# Patient Record
Sex: Female | Born: 1980 | Race: Black or African American | Hispanic: No | Marital: Single | Smoking: Never smoker
Health system: Southern US, Community
[De-identification: ages and names within clinical notes are randomized; demographics above are authoritative.]

## PROBLEM LIST (undated history)

## (undated) ENCOUNTER — Inpatient Hospital Stay (HOSPITAL_COMMUNITY): Payer: Self-pay

## (undated) DIAGNOSIS — N390 Urinary tract infection, site not specified: Secondary | ICD-10-CM

## (undated) DIAGNOSIS — R011 Cardiac murmur, unspecified: Secondary | ICD-10-CM

## (undated) DIAGNOSIS — N83209 Unspecified ovarian cyst, unspecified side: Secondary | ICD-10-CM

## (undated) DIAGNOSIS — Z8489 Family history of other specified conditions: Secondary | ICD-10-CM

## (undated) DIAGNOSIS — R87619 Unspecified abnormal cytological findings in specimens from cervix uteri: Secondary | ICD-10-CM

## (undated) DIAGNOSIS — D649 Anemia, unspecified: Secondary | ICD-10-CM

## (undated) DIAGNOSIS — IMO0002 Reserved for concepts with insufficient information to code with codable children: Secondary | ICD-10-CM

## (undated) HISTORY — DX: Unspecified abnormal cytological findings in specimens from cervix uteri: R87.619

## (undated) HISTORY — PX: THUMB ARTHROSCOPY: SHX2509

## (undated) HISTORY — PX: TONSILLECTOMY AND ADENOIDECTOMY: SHX28

## (undated) HISTORY — DX: Reserved for concepts with insufficient information to code with codable children: IMO0002

---
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
## 2001-12-04 ENCOUNTER — Emergency Department (HOSPITAL_COMMUNITY): Admission: EM | Admit: 2001-12-04 | Discharge: 2001-12-04 | Payer: Self-pay | Admitting: Emergency Medicine

## 2001-12-07 ENCOUNTER — Emergency Department (HOSPITAL_COMMUNITY): Admission: EM | Admit: 2001-12-07 | Discharge: 2001-12-08 | Payer: Self-pay | Admitting: Emergency Medicine

## 2001-12-28 ENCOUNTER — Encounter: Payer: Self-pay | Admitting: Endocrinology

## 2001-12-28 ENCOUNTER — Ambulatory Visit (HOSPITAL_COMMUNITY): Admission: RE | Admit: 2001-12-28 | Discharge: 2001-12-28 | Payer: Self-pay | Admitting: Endocrinology

## 2002-02-01 ENCOUNTER — Inpatient Hospital Stay (HOSPITAL_COMMUNITY): Admission: AD | Admit: 2002-02-01 | Discharge: 2002-02-01 | Payer: Self-pay | Admitting: Obstetrics and Gynecology

## 2002-06-28 ENCOUNTER — Other Ambulatory Visit: Admission: RE | Admit: 2002-06-28 | Discharge: 2002-06-28 | Payer: Self-pay | Admitting: Obstetrics and Gynecology

## 2002-09-29 ENCOUNTER — Encounter: Admission: RE | Admit: 2002-09-29 | Discharge: 2002-09-29 | Payer: Self-pay | Admitting: *Deleted

## 2002-09-30 ENCOUNTER — Emergency Department (HOSPITAL_COMMUNITY): Admission: EM | Admit: 2002-09-30 | Discharge: 2002-09-30 | Payer: Self-pay | Admitting: *Deleted

## 2002-10-06 ENCOUNTER — Ambulatory Visit (HOSPITAL_COMMUNITY): Admission: RE | Admit: 2002-10-06 | Discharge: 2002-10-06 | Payer: Self-pay | Admitting: *Deleted

## 2002-11-21 ENCOUNTER — Encounter: Admission: RE | Admit: 2002-11-21 | Discharge: 2002-11-21 | Payer: Self-pay | Admitting: Obstetrics and Gynecology

## 2004-01-29 ENCOUNTER — Encounter: Admission: RE | Admit: 2004-01-29 | Discharge: 2004-01-29 | Payer: Self-pay | Admitting: Obstetrics and Gynecology

## 2004-01-30 ENCOUNTER — Ambulatory Visit (HOSPITAL_COMMUNITY): Admission: RE | Admit: 2004-01-30 | Discharge: 2004-01-30 | Payer: Self-pay | Admitting: Obstetrics and Gynecology

## 2004-12-26 ENCOUNTER — Emergency Department (HOSPITAL_COMMUNITY): Admission: EM | Admit: 2004-12-26 | Discharge: 2004-12-26 | Payer: Self-pay | Admitting: Emergency Medicine

## 2008-12-14 ENCOUNTER — Ambulatory Visit: Payer: Self-pay | Admitting: Physician Assistant

## 2008-12-14 ENCOUNTER — Emergency Department (HOSPITAL_COMMUNITY): Admission: EM | Admit: 2008-12-14 | Discharge: 2008-12-14 | Payer: Self-pay | Admitting: Emergency Medicine

## 2008-12-14 ENCOUNTER — Inpatient Hospital Stay (HOSPITAL_COMMUNITY): Admission: AD | Admit: 2008-12-14 | Discharge: 2008-12-15 | Payer: Self-pay | Admitting: Obstetrics & Gynecology

## 2009-01-13 ENCOUNTER — Inpatient Hospital Stay (HOSPITAL_COMMUNITY): Admission: AD | Admit: 2009-01-13 | Discharge: 2009-01-13 | Payer: Self-pay | Admitting: Family Medicine

## 2009-07-02 ENCOUNTER — Emergency Department (HOSPITAL_COMMUNITY): Admission: EM | Admit: 2009-07-02 | Discharge: 2009-07-02 | Payer: Self-pay | Admitting: Family Medicine

## 2009-07-05 ENCOUNTER — Emergency Department (HOSPITAL_COMMUNITY): Admission: EM | Admit: 2009-07-05 | Discharge: 2009-07-06 | Payer: Self-pay | Admitting: Emergency Medicine

## 2009-07-07 ENCOUNTER — Emergency Department (HOSPITAL_COMMUNITY): Admission: EM | Admit: 2009-07-07 | Discharge: 2009-07-07 | Payer: Self-pay | Admitting: Emergency Medicine

## 2009-11-12 ENCOUNTER — Inpatient Hospital Stay (HOSPITAL_COMMUNITY): Admission: AD | Admit: 2009-11-12 | Discharge: 2009-11-12 | Payer: Self-pay | Admitting: Obstetrics & Gynecology

## 2009-11-12 ENCOUNTER — Ambulatory Visit: Payer: Self-pay | Admitting: Obstetrics and Gynecology

## 2010-03-17 ENCOUNTER — Emergency Department (HOSPITAL_COMMUNITY): Admission: EM | Admit: 2010-03-17 | Discharge: 2010-03-17 | Payer: Self-pay | Admitting: Emergency Medicine

## 2010-03-25 ENCOUNTER — Emergency Department (HOSPITAL_COMMUNITY): Admission: EM | Admit: 2010-03-25 | Discharge: 2010-03-25 | Payer: Self-pay | Admitting: Family Medicine

## 2010-04-13 ENCOUNTER — Inpatient Hospital Stay (HOSPITAL_COMMUNITY): Admission: AD | Admit: 2010-04-13 | Discharge: 2010-04-13 | Payer: Self-pay | Admitting: Obstetrics & Gynecology

## 2010-04-13 ENCOUNTER — Ambulatory Visit: Payer: Self-pay | Admitting: Nurse Practitioner

## 2010-04-28 ENCOUNTER — Inpatient Hospital Stay (HOSPITAL_COMMUNITY): Admission: AD | Admit: 2010-04-28 | Discharge: 2010-04-28 | Payer: Self-pay | Admitting: Obstetrics and Gynecology

## 2010-09-21 ENCOUNTER — Encounter: Payer: Self-pay | Admitting: *Deleted

## 2010-11-14 LAB — URINE MICROSCOPIC-ADD ON

## 2010-11-14 LAB — WET PREP, GENITAL: Trich, Wet Prep: NONE SEEN

## 2010-11-14 LAB — URINALYSIS, ROUTINE W REFLEX MICROSCOPIC
Bilirubin Urine: NEGATIVE
Bilirubin Urine: NEGATIVE
Glucose, UA: NEGATIVE mg/dL
Protein, ur: NEGATIVE mg/dL
Urobilinogen, UA: 0.2 mg/dL (ref 0.0–1.0)
pH: 5.5 (ref 5.0–8.0)
pH: 6 (ref 5.0–8.0)

## 2010-11-14 LAB — POCT PREGNANCY, URINE: Preg Test, Ur: NEGATIVE

## 2010-11-14 LAB — URINE CULTURE

## 2010-11-24 LAB — URINALYSIS, ROUTINE W REFLEX MICROSCOPIC
Bilirubin Urine: NEGATIVE
Leukocytes, UA: NEGATIVE
Nitrite: NEGATIVE
Protein, ur: NEGATIVE mg/dL
pH: 6.5 (ref 5.0–8.0)

## 2010-11-24 LAB — WET PREP, GENITAL

## 2010-11-24 LAB — POCT PREGNANCY, URINE: Preg Test, Ur: NEGATIVE

## 2010-11-24 LAB — URINE CULTURE: Colony Count: NO GROWTH

## 2010-11-24 LAB — URINE MICROSCOPIC-ADD ON

## 2010-11-24 LAB — GC/CHLAMYDIA PROBE AMP, GENITAL
Chlamydia, DNA Probe: NEGATIVE
GC Probe Amp, Genital: NEGATIVE

## 2010-12-03 LAB — URINE MICROSCOPIC-ADD ON

## 2010-12-03 LAB — URINALYSIS, ROUTINE W REFLEX MICROSCOPIC
Glucose, UA: NEGATIVE mg/dL
Protein, ur: NEGATIVE mg/dL

## 2010-12-03 LAB — POCT PREGNANCY, URINE: Preg Test, Ur: NEGATIVE

## 2010-12-09 LAB — CBC
Platelets: 246 10*3/uL (ref 150–400)
WBC: 4.8 10*3/uL (ref 4.0–10.5)

## 2010-12-10 LAB — WET PREP, GENITAL: Yeast Wet Prep HPF POC: NONE SEEN

## 2010-12-10 LAB — POCT URINALYSIS DIP (DEVICE)
Glucose, UA: NEGATIVE mg/dL
Ketones, ur: NEGATIVE mg/dL
Specific Gravity, Urine: 1.02 (ref 1.005–1.030)

## 2010-12-10 LAB — POCT PREGNANCY, URINE: Preg Test, Ur: POSITIVE

## 2010-12-10 LAB — ABO/RH: ABO/RH(D): A POS

## 2011-01-16 NOTE — Group Therapy Note (Signed)
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  She has been also seen by [facility] and is on Ortho Tri-  Cyclen and has had a recent Pap smear.  She is in today after a year since  her last ultrasound.  If something had to be done she wanted it done during  the summer when she does not have college and she is out for the summer now.  We are going to repeat her ultrasound to see where we stand with the size of  these.  If they have not grown I would go no further and we will follow this  depending on the results of the ultrasound.   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                                               Argentina Donovan, MD    PR/MEDQ  D:  01/29/2004  T:  01/29/2004  Job:  [id number]

## 2012-04-25 ENCOUNTER — Encounter (HOSPITAL_COMMUNITY): Payer: Self-pay | Admitting: *Deleted

## 2012-04-25 ENCOUNTER — Inpatient Hospital Stay (HOSPITAL_COMMUNITY): Payer: Medicaid Other

## 2012-04-25 ENCOUNTER — Inpatient Hospital Stay (HOSPITAL_COMMUNITY)
Admission: AD | Admit: 2012-04-25 | Discharge: 2012-04-25 | Disposition: A | Discharge status: Home / Self Care | Payer: Medicaid Other | Source: Ambulatory Visit | Attending: Obstetrics & Gynecology | Admitting: Obstetrics & Gynecology

## 2012-04-25 DIAGNOSIS — O2390 Unspecified genitourinary tract infection in pregnancy, unspecified trimester: Secondary | ICD-10-CM | POA: Insufficient documentation

## 2012-04-25 DIAGNOSIS — R109 Unspecified abdominal pain: Secondary | ICD-10-CM | POA: Insufficient documentation

## 2012-04-25 DIAGNOSIS — R102 Pelvic and perineal pain: Secondary | ICD-10-CM

## 2012-04-25 DIAGNOSIS — B9689 Other specified bacterial agents as the cause of diseases classified elsewhere: Secondary | ICD-10-CM | POA: Insufficient documentation

## 2012-04-25 DIAGNOSIS — A499 Bacterial infection, unspecified: Secondary | ICD-10-CM | POA: Insufficient documentation

## 2012-04-25 DIAGNOSIS — N949 Unspecified condition associated with female genital organs and menstrual cycle: Secondary | ICD-10-CM

## 2012-04-25 DIAGNOSIS — D279 Benign neoplasm of unspecified ovary: Secondary | ICD-10-CM | POA: Insufficient documentation

## 2012-04-25 DIAGNOSIS — M549 Dorsalgia, unspecified: Secondary | ICD-10-CM

## 2012-04-25 DIAGNOSIS — N760 Acute vaginitis: Secondary | ICD-10-CM | POA: Insufficient documentation

## 2012-04-25 HISTORY — DX: Urinary tract infection, site not specified: N39.0

## 2012-04-25 HISTORY — DX: Unspecified ovarian cyst, unspecified side: N83.209

## 2012-04-25 HISTORY — DX: Cardiac murmur, unspecified: R01.1

## 2012-04-25 HISTORY — DX: Anemia, unspecified: D64.9

## 2012-04-25 LAB — URINALYSIS, ROUTINE W REFLEX MICROSCOPIC
Bilirubin Urine: NEGATIVE
Nitrite: NEGATIVE
Specific Gravity, Urine: >1.03 — ABNORMAL HIGH (ref 1.005–1.030)
pH: 6 (ref 5.0–8.0)

## 2012-04-25 LAB — CBC
HCT: 38.9 % (ref 36.0–46.0)
MCH: 31 pg (ref 26.0–34.0)
MCHC: 33.9 g/dL (ref 30.0–36.0)
MCV: 91.3 fL (ref 78.0–100.0)
RDW: 12.5 % (ref 11.5–15.5)

## 2012-04-25 LAB — WET PREP, GENITAL: Yeast Wet Prep HPF POC: NONE SEEN

## 2012-04-25 LAB — URINE MICROSCOPIC-ADD ON

## 2012-04-25 LAB — POCT PREGNANCY, URINE: Preg Test, Ur: POSITIVE — AB

## 2012-04-25 MED ORDER — METRONIDAZOLE 500 MG PO TABS: 500.0000 mg | ORAL_TABLET | Freq: Two times a day (BID) | ORAL | Status: AC

## 2012-04-25 NOTE — MAU Provider Note (Signed)
History     CSN: [id number]  Arrival date and time: 04/25/12 4098   First Provider Initiated Contact with Patient 04/25/12 1007      Chief Complaint  Patient presents with  . Possible Pregnancy  . Abdominal Pain   HPI Katherine Cooley is 31 y.o. G2P0010 [redacted]w[redacted]d weeks presenting with lower abdominal pain.  Period is late and "I was praying I wasn't pregnant".  Test here is positive.  Denies vaginal bleeding or abnormal discharge.  Denies UTI.  Discomfort in the lower abdomen is midline and described as a menstrual cramp.  LMP normal for her on 03/24/12.  Patient is unsure of plans for this pregnancy.    Past Medical History  Diagnosis Date  . Heart murmur   . Anemia   . Urinary tract infection   . Ovarian cyst     Past Surgical History  Procedure Date  . Tonsillectomy and adenoidectomy     just adenoids    Family History  Problem Relation Age of Onset  . Other Father     History  Substance Use Topics  . Smoking status: Never Smoker   . Smokeless tobacco: Never Used  . Alcohol Use: Yes    Allergies: No Known Allergies  Prescriptions prior to admission  Medication Sig Dispense Refill  . ibuprofen (ADVIL,MOTRIN) 200 MG tablet Take 400 mg by mouth daily as needed. For pain. Pt also uses a prescription strength of Ibuprofen but she does not know  the strength. Has used in the last month.        Review of Systems  Constitutional: Negative.   Respiratory: Negative.   Cardiovascular: Negative.   Gastrointestinal: Positive for abdominal pain (lower abdominal cramping).  Genitourinary: Negative for dysuria and urgency.       Neg for bleeding or discharge   Physical Exam   Blood pressure 132/77, pulse 88, temperature 98.6 F (37 C), temperature source Oral, resp. rate 18, height 5' 5.5" (1.664 m), weight 69.854 kg (154 lb), last menstrual period 03/24/2012.  Physical Exam  Constitutional: She is oriented to person, place, and time. She appears well-developed and  well-nourished. No distress.  HENT:  Head: Normocephalic.  Neck: Normal range of motion.  Cardiovascular: Normal rate.   Respiratory: Effort normal.  GI: Soft. She exhibits no distension and no mass. There is no tenderness. There is no rebound and no guarding.  Genitourinary: Uterus is not enlarged and not tender. Cervix exhibits friability. Cervix exhibits no motion tenderness. Right adnexum displays no mass, no tenderness and no fullness. Left adnexum displays no mass, no tenderness and no fullness. No bleeding around the vagina. No vaginal discharge found.  Neurological: She is alert and oriented to person, place, and time.  Skin: Skin is warm and dry.  Psychiatric: She has a normal mood and affect. Her behavior is normal.    Results for orders placed during the hospital encounter of 04/25/12 (from the past 24 hour(s))  URINALYSIS, ROUTINE W REFLEX MICROSCOPIC     Status: Abnormal   Collection Time   04/25/12  9:20 AM      Component Value Range   Color, Urine YELLOW  YELLOW   APPearance CLEAR  CLEAR   Specific Gravity, Urine >1.030 (*) 1.005 - 1.030   pH 6.0  5.0 - 8.0   Glucose, UA NEGATIVE  NEGATIVE mg/dL   Hgb urine dipstick LARGE (*) NEGATIVE   Bilirubin Urine NEGATIVE  NEGATIVE   Ketones, ur 40 (*) NEGATIVE mg/dL  Protein, ur NEGATIVE  NEGATIVE mg/dL   Urobilinogen, UA 1.0  0.0 - 1.0 mg/dL   Nitrite NEGATIVE  NEGATIVE   Leukocytes, UA NEGATIVE  NEGATIVE  URINE MICROSCOPIC-ADD ON     Status: Abnormal   Collection Time   04/25/12  9:20 AM      Component Value Range   Squamous Epithelial / LPF FEW (*) RARE   WBC, UA 0-2  <3 WBC/hpf   RBC / HPF 7-10  <3 RBC/hpf   Bacteria, UA FEW (*) RARE   Urine-Other MUCOUS PRESENT    POCT PREGNANCY, URINE     Status: Abnormal   Collection Time   04/25/12  9:34 AM      Component Value Range   Preg Test, Ur POSITIVE (*) NEGATIVE  WET PREP, GENITAL     Status: Abnormal   Collection Time   04/25/12 10:21 AM      Component Value Range    Yeast Wet Prep HPF POC NONE SEEN  NONE SEEN   Trich, Wet Prep NONE SEEN  NONE SEEN   Clue Cells Wet Prep HPF POC FEW (*) NONE SEEN   WBC, Wet Prep HPF POC FEW (*) NONE SEEN  CBC     Status: Normal   Collection Time   04/25/12 10:30 AM      Component Value Range   WBC 7.0  4.0 - 10.5 K/uL   RBC 4.26  3.87 - 5.11 MIL/uL   Hemoglobin 13.2  12.0 - 15.0 g/dL   HCT 16.1  09.6 - 04.5 %   MCV 91.3  78.0 - 100.0 fL   MCH 31.0  26.0 - 34.0 pg   MCHC 33.9  30.0 - 36.0 g/dL   RDW 40.9  81.1 - 91.4 %   Platelets 298  150 - 400 K/uL  HCG, QUANTITATIVE, PREGNANCY     Status: Abnormal   Collection Time   04/25/12 10:30 AM      Component Value Range   hCG, Beta Chain, Quant, S 4289 (*) <5 mIU/mL   MAU Course  Procedures  GC/CHL cultures to lab  MDM   Assessment and Plan  A:  Intrauterine gestational sac at [redacted]w[redacted]d gestation     Abdominal pain in early pregnancy     Bacterial vaginosis    Dermoid cysts-known to patient and seen today on U/S    P:  Return for follow up BHCG in 48hrs      Rx for Flagyl     Return sooner for heavy bleeding or increase in abdominal pain  Alois Mincer,EVE M 04/25/2012, 10:11 AM

## 2012-04-25 NOTE — MAU Note (Signed)
Period was to have come on Wed.  Has not done a home test, has been cramping in lower abd- feels like going to start.

## 2012-04-26 LAB — GC/CHLAMYDIA PROBE AMP, GENITAL
Chlamydia, DNA Probe: NEGATIVE
GC Probe Amp, Genital: NEGATIVE

## 2012-04-27 ENCOUNTER — Encounter (HOSPITAL_COMMUNITY): Payer: Self-pay | Admitting: *Deleted

## 2012-04-27 ENCOUNTER — Inpatient Hospital Stay (HOSPITAL_COMMUNITY)
Admission: AD | Admit: 2012-04-27 | Discharge: 2012-04-27 | Disposition: A | Discharge status: Home / Self Care | Payer: Medicaid Other | Source: Ambulatory Visit | Attending: Obstetrics & Gynecology | Admitting: Obstetrics & Gynecology

## 2012-04-27 DIAGNOSIS — R109 Unspecified abdominal pain: Secondary | ICD-10-CM | POA: Insufficient documentation

## 2012-04-27 DIAGNOSIS — O99891 Other specified diseases and conditions complicating pregnancy: Secondary | ICD-10-CM | POA: Insufficient documentation

## 2012-04-27 DIAGNOSIS — O26899 Other specified pregnancy related conditions, unspecified trimester: Secondary | ICD-10-CM

## 2012-04-27 LAB — HCG, QUANTITATIVE, PREGNANCY: hCG, Beta Chain, Quant, S: 8971 m[IU]/mL — ABNORMAL HIGH (ref <5)

## 2012-04-27 NOTE — MAU Note (Signed)
Feeling ok, denies pain or bleeding.  Did get rx filled for flagyl, started it yesterday- doing ok.

## 2012-04-27 NOTE — MAU Provider Note (Signed)
Attestation of Attending Supervision of Advanced Practitioner (CNM/NP): Evaluation and management procedures were performed by the Advanced Practitioner under my supervision and collaboration.  I have reviewed the Advanced Practitioner's note and chart, and I agree with the management and plan.  Josseline Reddin, MD, FACOG Attending Obstetrician & Gynecologist Faculty Practice, [facility]  

## 2012-04-27 NOTE — MAU Provider Note (Signed)
  History     CSN: [id number]  Arrival date and time: 04/27/12 4098   First Provider Initiated Contact with Patient 04/27/12 [phone]      Chief Complaint  Patient presents with  . Follow-up   HPI Katherine Cooley 31 y.o. [redacted]w[redacted]d   Here today for repeat quant.  Was seen on 04-25-12 for abdominal pain in pregnancy.  Ultrasound showed IUGS but no yolk sac and no fetal pole.  No vaginal bleeding.  Slight cramping - 2/10, less than on 04-25-12.    OB History    Grav Para Term Preterm Abortions TAB SAB Ect Mult Living   2 0 0 0 1 0 1 0 0 0       Past Medical History  Diagnosis Date  . Heart murmur   . Anemia   . Urinary tract infection   . Ovarian cyst     Past Surgical History  Procedure Date  . Tonsillectomy and adenoidectomy     just adenoids    Family History  Problem Relation Age of Onset  . Other Father     History  Substance Use Topics  . Smoking status: Never Smoker   . Smokeless tobacco: Never Used  . Alcohol Use: Yes    Allergies: No Known Allergies  Prescriptions prior to admission  Medication Sig Dispense Refill  . metroNIDAZOLE (FLAGYL) 500 MG tablet Take 1 tablet (500 mg total) by mouth 2 (two) times daily.  14 tablet  0    Review of Systems  Constitutional: Negative for fever.  Gastrointestinal: Negative for nausea and vomiting.       Mild cramping  Genitourinary:       No vaginal discharge. No vaginal bleeding. No dysuria.   Physical Exam   Blood pressure 138/76, pulse 96, temperature 98.6 F (37 C), temperature source Oral, resp. rate 18, last menstrual period 03/24/2012.  Physical Exam  Nursing note and vitals reviewed. Constitutional: She is oriented to person, place, and time. She appears well-developed and well-nourished.  HENT:  Head: Normocephalic.  Eyes: EOM are normal.  Neck: Neck supple.  Musculoskeletal: Normal range of motion.  Neurological: She is alert and oriented to person, place, and time.  Skin: Skin is warm and dry.    Psychiatric: She has a normal mood and affect.    MAU Course  Procedures  MDM  Results for ANTONISHA, WASKEY (MRN [id number]) as of 04/27/2012 09:28  Ref. Range 04/25/2012 10:30 04/25/2012 11:02 04/25/2012 12:18 04/27/2012 02:12 04/27/2012 08:15  hCG, Beta Chain, Quant, S Latest Range: <5 mIU/mL 4289 (H)    8971 (H)    Assessment and Plan  Appropriately rising quants in pregnancy  Plan Will repeat ultrasound after 05-02-12 as no yolk sac seen yet on ultrasound. Advised to return with any vaginal bleeding or worsening abdominal pain. Take Tylenol 325 mg 2 tablets by mouth every 4 hours if needed for pain.   BURLESON,TERRI 04/27/2012, 9:25 AM

## 2012-05-03 ENCOUNTER — Ambulatory Visit (HOSPITAL_COMMUNITY)
Admit: 2012-05-03 | Discharge: 2012-05-03 | Disposition: A | Discharge status: Home / Self Care | Payer: Medicaid Other | Attending: Nurse Practitioner | Admitting: Nurse Practitioner

## 2012-05-03 ENCOUNTER — Inpatient Hospital Stay (HOSPITAL_COMMUNITY)
Admission: AD | Admit: 2012-05-03 | Discharge: 2012-05-03 | Disposition: A | Discharge status: Home / Self Care | Payer: Medicaid Other | Source: Ambulatory Visit | Attending: Obstetrics & Gynecology | Admitting: Obstetrics & Gynecology

## 2012-05-03 DIAGNOSIS — Z1389 Encounter for screening for other disorder: Secondary | ICD-10-CM

## 2012-05-03 DIAGNOSIS — R109 Unspecified abdominal pain: Secondary | ICD-10-CM | POA: Insufficient documentation

## 2012-05-03 DIAGNOSIS — O3680X0 Pregnancy with inconclusive fetal viability, not applicable or unspecified: Secondary | ICD-10-CM | POA: Insufficient documentation

## 2012-05-03 DIAGNOSIS — O34599 Maternal care for other abnormalities of gravid uterus, unspecified trimester: Secondary | ICD-10-CM | POA: Insufficient documentation

## 2012-05-03 DIAGNOSIS — D279 Benign neoplasm of unspecified ovary: Secondary | ICD-10-CM | POA: Insufficient documentation

## 2012-05-03 DIAGNOSIS — Z3490 Encounter for supervision of normal pregnancy, unspecified, unspecified trimester: Secondary | ICD-10-CM

## 2012-05-03 DIAGNOSIS — Z363 Encounter for antenatal screening for malformations: Secondary | ICD-10-CM

## 2012-05-03 DIAGNOSIS — O26899 Other specified pregnancy related conditions, unspecified trimester: Secondary | ICD-10-CM

## 2012-05-03 NOTE — MAU Note (Signed)
Pt here for repeat u/s for viability, denies bleeding, still notes mild abd cramping.

## 2012-05-03 NOTE — MAU Provider Note (Signed)
I have seen this pt and reviewed her U/S f/u results today.    S:  She denies abdominal pain, LOF, vaginal bleeding, vaginal itching/burning, urinary symptoms, h/a, dizziness, n/v, or fever/chills.    O: BP 118/73  Pulse 75  Temp 98.6 F (37 C) (Oral)  Resp 16  Ht 5' 5.5" (1.664 m)  Wt 65.091 kg (143 lb 8 oz)  BMI 23.52 kg/m2  LMP 03/24/2012  A: 1. Normal IUP (intrauterine pregnancy) on prenatal ultrasound   2.  Ovarian dermoids  P: Reviewed U/S findings with pt today.  She has known hx of dermoids.   D/C home Pregnancy verification letter and list of providers given Return to MAU as needed   Sharen Counter Certified Nurse-Midwife

## 2012-05-03 NOTE — MAU Provider Note (Signed)
Attestation of Attending Supervision of Advanced Practitioner (CNM/NP): Evaluation and management procedures were performed by the Advanced Practitioner under my supervision and collaboration.  I have reviewed the Advanced Practitioner's note and chart, and I agree with the management and plan.  HARRAWAY-Torti, Benedicto Capozzi 1:51 PM      

## 2012-06-15 ENCOUNTER — Encounter (HOSPITAL_COMMUNITY): Payer: Self-pay | Admitting: *Deleted

## 2012-06-15 ENCOUNTER — Inpatient Hospital Stay (HOSPITAL_COMMUNITY)
Admission: AD | Admit: 2012-06-15 | Discharge: 2012-06-15 | Disposition: A | Discharge status: Home / Self Care | Payer: Medicaid Other | Source: Ambulatory Visit | Attending: Obstetrics & Gynecology | Admitting: Obstetrics & Gynecology

## 2012-06-15 DIAGNOSIS — N390 Urinary tract infection, site not specified: Secondary | ICD-10-CM | POA: Insufficient documentation

## 2012-06-15 DIAGNOSIS — M545 Low back pain, unspecified: Secondary | ICD-10-CM | POA: Insufficient documentation

## 2012-06-15 DIAGNOSIS — O2340 Unspecified infection of urinary tract in pregnancy, unspecified trimester: Secondary | ICD-10-CM

## 2012-06-15 DIAGNOSIS — R109 Unspecified abdominal pain: Secondary | ICD-10-CM | POA: Insufficient documentation

## 2012-06-15 DIAGNOSIS — O2390 Unspecified genitourinary tract infection in pregnancy, unspecified trimester: Secondary | ICD-10-CM | POA: Insufficient documentation

## 2012-06-15 LAB — URINALYSIS, ROUTINE W REFLEX MICROSCOPIC
Bilirubin Urine: NEGATIVE
Glucose, UA: NEGATIVE mg/dL
Ketones, ur: >80 mg/dL — AB
Leukocytes, UA: NEGATIVE
Nitrite: NEGATIVE
Protein, ur: NEGATIVE mg/dL
Specific Gravity, Urine: >1.03 — ABNORMAL HIGH (ref 1.005–1.030)
Urobilinogen, UA: 0.2 mg/dL (ref 0.0–1.0)
pH: 6 (ref 5.0–8.0)

## 2012-06-15 LAB — URINE MICROSCOPIC-ADD ON

## 2012-06-15 MED ORDER — ACETAMINOPHEN 325 MG PO TABS
650.0000 mg | ORAL_TABLET | Freq: Once | ORAL | Status: AC
  Administered 2012-06-15: 650 mg via ORAL
  Filled 2012-06-15: qty 2

## 2012-06-15 MED ORDER — SULFAMETHOXAZOLE-TRIMETHOPRIM 800-160 MG PO TABS: 1.0000 | ORAL_TABLET | Freq: Two times a day (BID) | ORAL | Status: DC

## 2012-06-15 NOTE — MAU Note (Signed)
Patient states she has been having low back pain at night and abdominal pain off and on. Urinary frequency. Denies any bleeding or vaginal discharge.

## 2012-06-15 NOTE — MAU Provider Note (Signed)
  History     CSN: [id number]  Arrival date and time: 06/15/12 1706   First Provider Initiated Contact with Patient 06/15/12 1916      Chief Complaint  Patient presents with  . Back Pain  . Abdominal Pain   HPI Katherine Cooley 31 y.o. [redacted]w[redacted]d  Having bilateral low back pain and midline lower abdominal pain.  Thinks she may have a UTI.  OB History    Grav Para Term Preterm Abortions TAB SAB Ect Mult Living   2 0 0 0 1 0 1 0 0 0       Past Medical History  Diagnosis Date  . Heart murmur   . Anemia   . Urinary tract infection   . Ovarian cyst     Past Surgical History  Procedure Date  . Tonsillectomy and adenoidectomy     just adenoids    Family History  Problem Relation Age of Onset  . Other Father     History  Substance Use Topics  . Smoking status: Never Smoker   . Smokeless tobacco: Never Used  . Alcohol Use: Yes    Allergies:  Allergies  Allergen Reactions  . Latex Itching    Prescriptions prior to admission  Medication Sig Dispense Refill  . Prenatal Vit-Fe Fumarate-FA (PRENATAL MULTIVITAMIN) TABS Take 1 tablet by mouth every morning.        Review of Systems  Constitutional: Negative for fever.  Gastrointestinal: Positive for abdominal pain. Negative for nausea, vomiting, diarrhea and constipation.  Musculoskeletal: Positive for back pain.   Physical Exam   Blood pressure 125/70, pulse 77, temperature 98.5 F (36.9 C), temperature source Oral, resp. rate 16, height 5\' 7"  (1.702 m), weight 67.586 kg (149 lb), last menstrual period 03/24/2012, SpO2 100.00%.  Physical Exam  Nursing note and vitals reviewed. Constitutional: She is oriented to person, place, and time. She appears well-developed and well-nourished.  HENT:  Head: Normocephalic.  Eyes: EOM are normal.  Neck: Neck supple.  GI: Soft. There is tenderness. There is no rebound and no guarding.       Mild lower abdominal tenderness  Genitourinary:       Cervix - closed, thick and  firm  Musculoskeletal: Normal range of motion.  Neurological: She is alert and oriented to person, place, and time.  Skin: Skin is warm and dry.  Psychiatric: She has a normal mood and affect.    MAU Course  Procedures  MDM First urine collection inadvertently did not get sent to the lab.  Will collect again and send for urinalysis.  Assessment and Plan  Care assumed by H. Mathews Robinsons, CNM at 2000  1. Urinary tract infection affecting care of mother, antepartum   Bactrim DS BID x7 Will call and schedule appointment with [facility] importance of early prenatal care  BURLESON,TERRI 06/15/2012, 7:30 PM

## 2012-06-17 NOTE — MAU Provider Note (Signed)
Attestation of Attending Supervision of Advanced Practitioner (CNM/NP): Evaluation and management procedures were performed by the Advanced Practitioner under my supervision and collaboration.  I have reviewed the Advanced Practitioner's note and chart, and I agree with the management and plan.  HARRAWAY-Lamarche, Vyom Brass 12:45 PM     

## 2012-07-04 ENCOUNTER — Ambulatory Visit (INDEPENDENT_AMBULATORY_CARE_PROVIDER_SITE_OTHER): Payer: Medicaid Other | Admitting: Obstetrics and Gynecology

## 2012-07-04 DIAGNOSIS — Z331 Pregnant state, incidental: Secondary | ICD-10-CM

## 2012-07-04 LAB — POCT URINALYSIS DIPSTICK
Glucose, UA: NEGATIVE
Nitrite, UA: NEGATIVE
Urobilinogen, UA: 1

## 2012-07-04 NOTE — Progress Notes (Signed)
NOB interview. Was treated at [facility] for UTI 06/15/12. Denies any sx currently. Pt is a fraternal twin.

## 2012-07-05 LAB — PRENATAL PANEL VII
Basophils Relative: 0 % (ref 0–1)
HCT: 35.1 % — ABNORMAL LOW (ref 36.0–46.0)
HIV: NONREACTIVE
Hemoglobin: 12 g/dL (ref 12.0–15.0)
Lymphocytes Relative: 28 % (ref 12–46)
Lymphs Abs: 2.6 10*3/uL (ref 0.7–4.0)
MCHC: 34.2 g/dL (ref 30.0–36.0)
Monocytes Absolute: 0.6 10*3/uL (ref 0.1–1.0)
Monocytes Relative: 6 % (ref 3–12)
Neutro Abs: 6.1 10*3/uL (ref 1.7–7.7)
Rh Type: POSITIVE
Rubella: 121.5 IU/mL — ABNORMAL HIGH

## 2012-07-06 ENCOUNTER — Encounter: Payer: Self-pay | Admitting: Obstetrics and Gynecology

## 2012-07-06 ENCOUNTER — Ambulatory Visit (INDEPENDENT_AMBULATORY_CARE_PROVIDER_SITE_OTHER): Payer: Medicaid Other | Admitting: Obstetrics and Gynecology

## 2012-07-06 VITALS — BP 100/62 | Wt 145.0 lb

## 2012-07-06 DIAGNOSIS — Z3689 Encounter for other specified antenatal screening: Secondary | ICD-10-CM

## 2012-07-06 DIAGNOSIS — Z3490 Encounter for supervision of normal pregnancy, unspecified, unspecified trimester: Secondary | ICD-10-CM

## 2012-07-06 DIAGNOSIS — Z331 Pregnant state, incidental: Secondary | ICD-10-CM

## 2012-07-06 DIAGNOSIS — Z124 Encounter for screening for malignant neoplasm of cervix: Secondary | ICD-10-CM

## 2012-07-06 LAB — POCT URINALYSIS DIPSTICK
Bilirubin, UA: NEGATIVE
Glucose, UA: NEGATIVE
Nitrite, UA: NEGATIVE
Spec Grav, UA: <=1.005

## 2012-07-06 LAB — POCT WET PREP (WET MOUNT): pH: 4.5

## 2012-07-06 LAB — CULTURE, OB URINE
Colony Count: NO GROWTH
Organism ID, Bacteria: NO GROWTH
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
## 2012-07-06 NOTE — Progress Notes (Signed)
[redacted]w[redacted]d LMP--03/24/2012 Last pap smear ?

## 2012-07-07 LAB — PAP IG, CT-NG, RFX HPV ASCU

## 2012-08-03 ENCOUNTER — Ambulatory Visit (INDEPENDENT_AMBULATORY_CARE_PROVIDER_SITE_OTHER): Payer: Medicaid Other

## 2012-08-03 ENCOUNTER — Ambulatory Visit (INDEPENDENT_AMBULATORY_CARE_PROVIDER_SITE_OTHER): Payer: Medicaid Other | Admitting: Obstetrics and Gynecology

## 2012-08-03 VITALS — BP 102/60 | Wt 150.0 lb

## 2012-08-03 DIAGNOSIS — Z1389 Encounter for screening for other disorder: Secondary | ICD-10-CM

## 2012-08-03 DIAGNOSIS — Z3490 Encounter for supervision of normal pregnancy, unspecified, unspecified trimester: Secondary | ICD-10-CM

## 2012-08-03 DIAGNOSIS — Z3689 Encounter for other specified antenatal screening: Secondary | ICD-10-CM

## 2012-08-03 DIAGNOSIS — Z363 Encounter for antenatal screening for malformations: Secondary | ICD-10-CM

## 2012-08-03 DIAGNOSIS — D279 Benign neoplasm of unspecified ovary: Secondary | ICD-10-CM

## 2012-08-03 NOTE — Progress Notes (Signed)
Pt stated no issues today.  Ultrasound shows:  SIUP  S=D     [place] EDD: 12/29/2012             EFW: 10 oz           Cervical length: 5.60 cm           Placenta localization: posterior           Fetal presentation: breech                    Anatomy survey is normal           Gender : female Comments:Breech presentation, posterior placenta (placental edge is 2.6cm from internal os normal.fluid is normal (vertical pocket = 3.6 cm No anomalies seen (profile,palate,nasal bone ,philtrum,open hands,5th digit,feet,heel seen. Bilateral dermoid cyst are noted (right dermoid =1.3x 1.0x 1.1 cm,left dermoid =2.7 x 1.6x 2.5cm ( right dermoid appears smaller than previously seen. Left dermoid size is  Unchanged . There is no color doppler flow present on either dermoid cyst. No fluid in CDS, normal adenexas . Follow up dermoid cyst as clinically indicated .

## 2012-08-04 LAB — US OB COMP + 14 WK

## 2012-08-30 ENCOUNTER — Ambulatory Visit (INDEPENDENT_AMBULATORY_CARE_PROVIDER_SITE_OTHER): Payer: Medicaid Other | Admitting: Obstetrics and Gynecology

## 2012-08-30 ENCOUNTER — Encounter: Payer: Self-pay | Admitting: Obstetrics and Gynecology

## 2012-08-30 VITALS — BP 100/60 | Wt 155.0 lb

## 2012-08-30 DIAGNOSIS — Z331 Pregnant state, incidental: Secondary | ICD-10-CM

## 2012-08-30 NOTE — Progress Notes (Signed)
Pt c/o back pain and some pressure in lower part of stomach. Pt also c/o numbness in left arm.

## 2012-08-30 NOTE — Patient Instructions (Signed)

## 2012-08-30 NOTE — Progress Notes (Signed)
[redacted]w[redacted]d Pt denies ctx, lof or vb Has lower back pain Back no CVATB glucola @[place] 

## 2012-09-27 ENCOUNTER — Encounter: Payer: Medicaid Other | Admitting: Obstetrics and Gynecology

## 2012-09-27 ENCOUNTER — Other Ambulatory Visit: Payer: Medicaid Other

## 2012-10-03 ENCOUNTER — Other Ambulatory Visit: Payer: Medicaid Other

## 2012-10-03 ENCOUNTER — Encounter: Payer: Medicaid Other | Admitting: Obstetrics and Gynecology

## 2012-10-04 ENCOUNTER — Ambulatory Visit: Payer: Medicaid Other | Admitting: Obstetrics and Gynecology

## 2012-10-04 ENCOUNTER — Other Ambulatory Visit: Payer: Medicaid Other

## 2012-10-04 VITALS — BP 110/60 | Wt 160.0 lb

## 2012-10-04 DIAGNOSIS — Z3490 Encounter for supervision of normal pregnancy, unspecified, unspecified trimester: Secondary | ICD-10-CM

## 2012-10-04 NOTE — Progress Notes (Signed)
[redacted]w[redacted]d No complaints today. 1 hr gtt today.

## 2012-10-06 LAB — GLUCOSE TOLERANCE, 1 HOUR (50G) W/O FASTING: Glucose, 1 Hour GTT: 128 mg/dL (ref 70–140)

## 2012-10-18 ENCOUNTER — Encounter: Payer: Medicaid Other | Admitting: Obstetrics and Gynecology

## 2012-10-18 ENCOUNTER — Ambulatory Visit: Payer: Medicaid Other | Admitting: Obstetrics and Gynecology

## 2012-10-18 ENCOUNTER — Encounter: Payer: Self-pay | Admitting: Obstetrics and Gynecology

## 2012-10-18 VITALS — BP 118/68 | Wt 162.0 lb

## 2012-10-18 DIAGNOSIS — R102 Pelvic and perineal pain: Secondary | ICD-10-CM

## 2012-10-18 DIAGNOSIS — Z3490 Encounter for supervision of normal pregnancy, unspecified, unspecified trimester: Secondary | ICD-10-CM

## 2012-10-18 DIAGNOSIS — R109 Unspecified abdominal pain: Secondary | ICD-10-CM

## 2012-10-18 DIAGNOSIS — N390 Urinary tract infection, site not specified: Secondary | ICD-10-CM

## 2012-10-18 LAB — POCT URINALYSIS DIPSTICK
Bilirubin, UA: NEGATIVE
Glucose, UA: NEGATIVE
Ketones, UA: NEGATIVE
Nitrite, UA: NEGATIVE
pH, UA: 7

## 2012-10-18 NOTE — Progress Notes (Signed)
[redacted]w[redacted]d Pt c/o back pain and cramping, not regular.  No meds required Chem 10 urine suspicious.   Culture sent

## 2012-10-19 LAB — CULTURE, OB URINE
Colony Count: NO GROWTH
Organism ID, Bacteria: NO GROWTH

## 2012-10-26 ENCOUNTER — Telehealth: Payer: Self-pay | Admitting: Obstetrics and Gynecology

## 2012-10-26 NOTE — Telephone Encounter (Signed)
TC from pt. States has been having cramping x a few days and sometimes feels tightness in abd. Occurred frequently 10/25/12 but today is about once/hour.  Also feeling somewhat lightheaded after eating sausage biscuit and water.  Advised to eat more protein. +FM. No UTI sx.  ADvised increased water. Will consult with provider.

## 2012-10-26 NOTE — Telephone Encounter (Signed)
LM to return call.

## 2012-10-26 NOTE — Telephone Encounter (Signed)
Tc fro pt. Per [facility] advised increased water. If cramping/cont occur 5 or mor times per hr or has any other concerns to call.  Pt verbalizes comprehension. SChed appt 10/27/12

## 2012-10-27 ENCOUNTER — Ambulatory Visit: Payer: Medicaid Other | Admitting: Certified Nurse Midwife

## 2012-10-27 VITALS — BP 118/60 | Wt 164.0 lb

## 2012-10-27 DIAGNOSIS — Z331 Pregnant state, incidental: Secondary | ICD-10-CM

## 2012-10-27 DIAGNOSIS — N760 Acute vaginitis: Secondary | ICD-10-CM

## 2012-10-27 DIAGNOSIS — N390 Urinary tract infection, site not specified: Secondary | ICD-10-CM

## 2012-10-27 NOTE — Progress Notes (Deleted)
Cramping on a scale of 10 she is a 10 Dizziness Pt is 63w0d1

## 2012-10-27 NOTE — Progress Notes (Signed)
See note below for 10-27-2012

## 2012-10-28 ENCOUNTER — Telehealth: Payer: Self-pay | Admitting: Obstetrics and Gynecology

## 2012-10-28 ENCOUNTER — Other Ambulatory Visit: Payer: Self-pay | Admitting: Obstetrics and Gynecology

## 2012-10-28 ENCOUNTER — Telehealth: Payer: Self-pay | Admitting: Certified Nurse Midwife

## 2012-10-28 LAB — PAP IG W/ RFLX HPV ASCU

## 2012-10-28 MED ORDER — NITROFURANTOIN MONOHYD MACRO 100 MG PO CAPS: 100.0000 mg | ORAL_CAPSULE | Freq: Two times a day (BID) | ORAL | Status: AC

## 2012-10-28 NOTE — Telephone Encounter (Signed)
Returned pt's call.States Rx for antibiotic not called in. Clarified with [facility]. Macrobid ordered. Was called to W/L. Should have been to [facility]. Rx reordered.

## 2012-10-28 NOTE — Telephone Encounter (Signed)
Returned pt's call. LM to return call or call after hours. Instructions given

## 2012-11-01 ENCOUNTER — Encounter: Payer: Medicaid Other | Admitting: Obstetrics and Gynecology

## 2012-11-09 ENCOUNTER — Ambulatory Visit: Payer: Medicaid Other | Admitting: Family Medicine

## 2012-11-09 VITALS — BP 112/60 | Wt 166.0 lb

## 2012-11-09 DIAGNOSIS — Z331 Pregnant state, incidental: Secondary | ICD-10-CM

## 2012-11-09 NOTE — Progress Notes (Signed)
[redacted]w[redacted]d Doing well, good fetal movement. Has decided to have circumcision done here in the office. Request paperwork to have frequent bathroom visits for work. ROB in 2 weeks. L.Sheneika Walstad, FNP-BC

## 2012-11-09 NOTE — Progress Notes (Signed)
[redacted]w[redacted]d No complaints today.

## 2012-12-07 ENCOUNTER — Encounter: Payer: Self-pay | Admitting: Obstetrics and Gynecology

## 2012-12-07 LAB — OB RESULTS CONSOLE GBS: GBS: NEGATIVE

## 2012-12-17 ENCOUNTER — Inpatient Hospital Stay (HOSPITAL_COMMUNITY)
Admission: AD | Admit: 2012-12-17 | Discharge: 2012-12-17 | Disposition: A | Discharge status: Home / Self Care | Payer: Medicaid Other | Source: Ambulatory Visit | Attending: Obstetrics and Gynecology | Admitting: Obstetrics and Gynecology

## 2012-12-17 ENCOUNTER — Inpatient Hospital Stay (HOSPITAL_COMMUNITY)
Admission: AD | Admit: 2012-12-17 | Discharge: 2012-12-21 | DRG: 766 | Disposition: A | Discharge status: Home / Self Care | Payer: Medicaid Other | Source: Ambulatory Visit | Attending: Obstetrics and Gynecology | Admitting: Obstetrics and Gynecology

## 2012-12-17 ENCOUNTER — Encounter (HOSPITAL_COMMUNITY): Payer: Self-pay | Admitting: *Deleted

## 2012-12-17 DIAGNOSIS — D279 Benign neoplasm of unspecified ovary: Secondary | ICD-10-CM | POA: Diagnosis present

## 2012-12-17 DIAGNOSIS — Z91040 Latex allergy status: Secondary | ICD-10-CM | POA: Diagnosis present

## 2012-12-17 DIAGNOSIS — O479 False labor, unspecified: Secondary | ICD-10-CM | POA: Insufficient documentation

## 2012-12-17 DIAGNOSIS — Z9889 Other specified postprocedural states: Secondary | ICD-10-CM

## 2012-12-17 DIAGNOSIS — IMO0001 Reserved for inherently not codable concepts without codable children: Secondary | ICD-10-CM

## 2012-12-17 DIAGNOSIS — O34599 Maternal care for other abnormalities of gravid uterus, unspecified trimester: Secondary | ICD-10-CM | POA: Diagnosis present

## 2012-12-17 DIAGNOSIS — O3440 Maternal care for other abnormalities of cervix, unspecified trimester: Secondary | ICD-10-CM

## 2012-12-17 DIAGNOSIS — Z98891 History of uterine scar from previous surgery: Secondary | ICD-10-CM

## 2012-12-17 DIAGNOSIS — D649 Anemia, unspecified: Secondary | ICD-10-CM | POA: Diagnosis not present

## 2012-12-17 DIAGNOSIS — O9903 Anemia complicating the puerperium: Secondary | ICD-10-CM | POA: Diagnosis not present

## 2012-12-17 DIAGNOSIS — O99891 Other specified diseases and conditions complicating pregnancy: Secondary | ICD-10-CM | POA: Insufficient documentation

## 2012-12-17 DIAGNOSIS — O322XX0 Maternal care for transverse and oblique lie, not applicable or unspecified: Secondary | ICD-10-CM | POA: Diagnosis present

## 2012-12-17 DIAGNOSIS — K219 Gastro-esophageal reflux disease without esophagitis: Secondary | ICD-10-CM | POA: Insufficient documentation

## 2012-12-17 LAB — CBC
Hemoglobin: 11.6 g/dL — ABNORMAL LOW (ref 12.0–15.0)
MCH: 30.4 pg (ref 26.0–34.0)
Platelets: 254 10*3/uL (ref 150–400)
RBC: 3.81 MIL/uL — ABNORMAL LOW (ref 3.87–5.11)
WBC: 12.1 10*3/uL — ABNORMAL HIGH (ref 4.0–10.5)

## 2012-12-17 MED ORDER — LACTATED RINGERS IV SOLN
500.0000 mL | INTRAVENOUS | Status: DC | PRN
  Administered 2012-12-18: 500 mL via INTRAVENOUS

## 2012-12-17 MED ORDER — LIDOCAINE HCL (PF) 1 % IJ SOLN: 30.0000 mL | INTRAMUSCULAR | Status: DC | PRN

## 2012-12-17 MED ORDER — ACETAMINOPHEN 325 MG PO TABS: 650.0000 mg | ORAL_TABLET | ORAL | Status: DC | PRN

## 2012-12-17 MED ORDER — BUTORPHANOL TARTRATE 1 MG/ML IJ SOLN
1.0000 mg | INTRAMUSCULAR | Status: DC | PRN
  Administered 2012-12-17: 1 mg via INTRAVENOUS
  Filled 2012-12-17: qty 1

## 2012-12-17 MED ORDER — CITRIC ACID-SODIUM CITRATE 334-500 MG/5ML PO SOLN
30.0000 mL | Freq: Once | ORAL | Status: AC
  Administered 2012-12-17: 30 mL via ORAL
  Filled 2012-12-17: qty 15

## 2012-12-17 MED ORDER — FENTANYL 2.5 MCG/ML BUPIVACAINE 1/10 % EPIDURAL INFUSION (WH - ANES)
14.0000 mL/h | INTRAMUSCULAR | Status: DC | PRN
  Administered 2012-12-18: 14 mL/h via EPIDURAL
  Filled 2012-12-17 (×2): qty 125

## 2012-12-17 MED ORDER — ONDANSETRON HCL 4 MG/2ML IJ SOLN: 4.0000 mg | Freq: Four times a day (QID) | INTRAMUSCULAR | Status: DC | PRN

## 2012-12-17 MED ORDER — IBUPROFEN 600 MG PO TABS: 600.0000 mg | ORAL_TABLET | Freq: Four times a day (QID) | ORAL | Status: DC | PRN

## 2012-12-17 MED ORDER — PROMETHAZINE HCL 25 MG/ML IJ SOLN
12.5000 mg | Freq: Once | INTRAMUSCULAR | Status: AC
  Administered 2012-12-17: 12.5 mg via INTRAVENOUS
  Filled 2012-12-17: qty 1

## 2012-12-17 MED ORDER — EPHEDRINE 5 MG/ML INJ: 10.0000 mg | INTRAVENOUS | Status: DC | PRN

## 2012-12-17 MED ORDER — LACTATED RINGERS IV BOLUS (SEPSIS)
250.0000 mL | Freq: Once | INTRAVENOUS | Status: AC
  Administered 2012-12-17: 1000 mL via INTRAVENOUS

## 2012-12-17 MED ORDER — DIPHENHYDRAMINE HCL 50 MG/ML IJ SOLN: 12.5000 mg | INTRAMUSCULAR | Status: DC | PRN

## 2012-12-17 MED ORDER — OXYTOCIN 40 UNITS IN LACTATED RINGERS INFUSION - SIMPLE MED
62.5000 mL/h | INTRAVENOUS | Status: DC
  Filled 2012-12-17: qty 1000

## 2012-12-17 MED ORDER — PHENYLEPHRINE 40 MCG/ML (10ML) SYRINGE FOR IV PUSH (FOR BLOOD PRESSURE SUPPORT)
80.0000 ug | PREFILLED_SYRINGE | INTRAVENOUS | Status: DC | PRN
  Filled 2012-12-17: qty 5

## 2012-12-17 MED ORDER — CITRIC ACID-SODIUM CITRATE 334-500 MG/5ML PO SOLN
30.0000 mL | ORAL | Status: DC | PRN
  Administered 2012-12-18 (×3): 30 mL via ORAL
  Filled 2012-12-17 (×4): qty 15

## 2012-12-17 MED ORDER — PHENYLEPHRINE 40 MCG/ML (10ML) SYRINGE FOR IV PUSH (FOR BLOOD PRESSURE SUPPORT): 80.0000 ug | PREFILLED_SYRINGE | INTRAVENOUS | Status: DC | PRN

## 2012-12-17 MED ORDER — OXYTOCIN BOLUS FROM INFUSION: 500.0000 mL | INTRAVENOUS | Status: DC

## 2012-12-17 MED ORDER — LACTATED RINGERS IV SOLN: INTRAVENOUS | Status: DC

## 2012-12-17 MED ORDER — LACTATED RINGERS IV SOLN
INTRAVENOUS | Status: DC
  Administered 2012-12-17 – 2012-12-18 (×5): via INTRAVENOUS

## 2012-12-17 MED ORDER — LACTATED RINGERS IV SOLN
500.0000 mL | Freq: Once | INTRAVENOUS | Status: AC
  Administered 2012-12-18: 500 mL via INTRAVENOUS

## 2012-12-17 MED ORDER — BUTORPHANOL TARTRATE 1 MG/ML IJ SOLN
2.0000 mg | Freq: Once | INTRAMUSCULAR | Status: AC
  Administered 2012-12-17: 2 mg via INTRAVENOUS
  Filled 2012-12-17: qty 2

## 2012-12-17 MED ORDER — FLEET ENEMA 7-19 GM/118ML RE ENEM: 1.0000 | ENEMA | RECTAL | Status: DC | PRN

## 2012-12-17 MED ORDER — EPHEDRINE 5 MG/ML INJ
10.0000 mg | INTRAVENOUS | Status: DC | PRN
  Filled 2012-12-17: qty 4

## 2012-12-17 MED ORDER — OXYCODONE-ACETAMINOPHEN 5-325 MG PO TABS: 1.0000 | ORAL_TABLET | ORAL | Status: DC | PRN
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
## 2012-12-17 NOTE — MAU Note (Addendum)
PT SAYS HER UC  ALL DAY- BUT WORSE AT 8PM.  PT WAS IN MAU  LAST NIGHT

## 2012-12-17 NOTE — MAU Note (Signed)
PT SAYS SHE HAS BEEN HAVING UC SINCE YESTERDAY - WORSE AT 2300.    VE IN OFFICE ON Monday 1 CM.   DENIES HSV AND MRSA.
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
## 2012-12-17 NOTE — Progress Notes (Signed)
Report called to [facility] in [facility]. Pt will go to room 162

## 2012-12-17 NOTE — MAU Note (Signed)
Contractions since Friday but stronger since 2200. Denies bleeding or leaking fld

## 2012-12-18 ENCOUNTER — Inpatient Hospital Stay (HOSPITAL_COMMUNITY): Payer: Medicaid Other | Admitting: Anesthesiology

## 2012-12-18 ENCOUNTER — Encounter (HOSPITAL_COMMUNITY): Payer: Self-pay | Admitting: Anesthesiology

## 2012-12-18 ENCOUNTER — Encounter (HOSPITAL_COMMUNITY)
Admission: AD | Disposition: A | Discharge status: Home / Self Care | Payer: Self-pay | Source: Ambulatory Visit | Attending: Obstetrics and Gynecology

## 2012-12-18 LAB — RPR: RPR Ser Ql: NONREACTIVE

## 2012-12-18 SURGERY — Surgical Case
Anesthesia: Epidural | Site: Abdomen | Wound class: Clean Contaminated

## 2012-12-18 MED ORDER — ZOLPIDEM TARTRATE 5 MG PO TABS: 5.0000 mg | ORAL_TABLET | Freq: Every evening | ORAL | Status: DC | PRN

## 2012-12-18 MED ORDER — NALOXONE HCL 1 MG/ML IJ SOLN: 1.0000 ug/kg/h | INTRAVENOUS | Status: DC | PRN

## 2012-12-18 MED ORDER — BUPIVACAINE-EPINEPHRINE 0.5% -1:200000 IJ SOLN
INTRAMUSCULAR | Status: DC | PRN
  Administered 2012-12-18: 20 mL

## 2012-12-18 MED ORDER — KETOROLAC TROMETHAMINE 30 MG/ML IJ SOLN: 30.0000 mg | Freq: Four times a day (QID) | INTRAMUSCULAR | Status: AC | PRN

## 2012-12-18 MED ORDER — LANOLIN HYDROUS EX OINT: 1.0000 "application " | TOPICAL_OINTMENT | CUTANEOUS | Status: DC | PRN

## 2012-12-18 MED ORDER — MEPERIDINE HCL 25 MG/ML IJ SOLN
INTRAMUSCULAR | Status: AC
  Filled 2012-12-18: qty 1

## 2012-12-18 MED ORDER — SODIUM BICARBONATE 8.4 % IV SOLN
INTRAVENOUS | Status: DC | PRN
  Administered 2012-12-18: 5 mL via EPIDURAL

## 2012-12-18 MED ORDER — OXYTOCIN 40 UNITS IN LACTATED RINGERS INFUSION - SIMPLE MED: 62.5000 mL/h | INTRAVENOUS | Status: AC

## 2012-12-18 MED ORDER — MAGNESIUM HYDROXIDE 400 MG/5ML PO SUSP: 30.0000 mL | ORAL | Status: DC | PRN

## 2012-12-18 MED ORDER — DIPHENHYDRAMINE HCL 25 MG PO CAPS: 25.0000 mg | ORAL_CAPSULE | Freq: Four times a day (QID) | ORAL | Status: DC | PRN

## 2012-12-18 MED ORDER — SCOPOLAMINE 1 MG/3DAYS TD PT72
MEDICATED_PATCH | TRANSDERMAL | Status: AC
  Filled 2012-12-18: qty 1

## 2012-12-18 MED ORDER — ONDANSETRON HCL 4 MG/2ML IJ SOLN
INTRAMUSCULAR | Status: AC
  Filled 2012-12-18: qty 2

## 2012-12-18 MED ORDER — LACTATED RINGERS IV SOLN
INTRAVENOUS | Status: DC
  Administered 2012-12-19: 01:00:00 via INTRAVENOUS

## 2012-12-18 MED ORDER — MENTHOL 3 MG MT LOZG: 1.0000 | LOZENGE | OROMUCOSAL | Status: DC | PRN

## 2012-12-18 MED ORDER — BUPIVACAINE IN DEXTROSE 0.75-8.25 % IT SOLN
INTRATHECAL | Status: DC | PRN
  Administered 2012-12-18: 1.2 mL via INTRATHECAL

## 2012-12-18 MED ORDER — IBUPROFEN 600 MG PO TABS
600.0000 mg | ORAL_TABLET | Freq: Four times a day (QID) | ORAL | Status: DC
  Administered 2012-12-19 – 2012-12-21 (×10): 600 mg via ORAL
  Filled 2012-12-18 (×10): qty 1

## 2012-12-18 MED ORDER — DIPHENHYDRAMINE HCL 25 MG PO CAPS: 25.0000 mg | ORAL_CAPSULE | ORAL | Status: DC | PRN

## 2012-12-18 MED ORDER — OXYTOCIN 10 UNIT/ML IJ SOLN
40.0000 [IU] | INTRAVENOUS | Status: DC | PRN
  Administered 2012-12-18: 40 [IU] via INTRAVENOUS

## 2012-12-18 MED ORDER — FENTANYL CITRATE 0.05 MG/ML IJ SOLN: 25.0000 ug | INTRAMUSCULAR | Status: DC | PRN

## 2012-12-18 MED ORDER — SIMETHICONE 80 MG PO CHEW
80.0000 mg | CHEWABLE_TABLET | Freq: Three times a day (TID) | ORAL | Status: DC
  Administered 2012-12-18 – 2012-12-21 (×9): 80 mg via ORAL

## 2012-12-18 MED ORDER — MEPERIDINE HCL 25 MG/ML IJ SOLN: 6.2500 mg | INTRAMUSCULAR | Status: DC | PRN

## 2012-12-18 MED ORDER — MEDROXYPROGESTERONE ACETATE 150 MG/ML IM SUSP: 150.0000 mg | INTRAMUSCULAR | Status: DC | PRN

## 2012-12-18 MED ORDER — OXYTOCIN 10 UNIT/ML IJ SOLN
INTRAMUSCULAR | Status: AC
  Filled 2012-12-18: qty 4

## 2012-12-18 MED ORDER — OXYTOCIN 40 UNITS IN LACTATED RINGERS INFUSION - SIMPLE MED
1.0000 m[IU]/min | INTRAVENOUS | Status: DC
  Administered 2012-12-18: 1 m[IU]/min via INTRAVENOUS

## 2012-12-18 MED ORDER — SENNOSIDES-DOCUSATE SODIUM 8.6-50 MG PO TABS: 2.0000 | ORAL_TABLET | Freq: Every day | ORAL | Status: DC

## 2012-12-18 MED ORDER — SIMETHICONE 80 MG PO CHEW: 80.0000 mg | CHEWABLE_TABLET | Freq: Three times a day (TID) | ORAL | Status: DC

## 2012-12-18 MED ORDER — KETOROLAC TROMETHAMINE 30 MG/ML IJ SOLN
30.0000 mg | Freq: Four times a day (QID) | INTRAMUSCULAR | Status: AC | PRN
  Administered 2012-12-18: 30 mg via INTRAVENOUS
  Filled 2012-12-18: qty 1

## 2012-12-18 MED ORDER — NALBUPHINE HCL 10 MG/ML IJ SOLN: 5.0000 mg | INTRAMUSCULAR | Status: DC | PRN

## 2012-12-18 MED ORDER — TETANUS-DIPHTH-ACELL PERTUSSIS 5-2.5-18.5 LF-MCG/0.5 IM SUSP
0.5000 mL | Freq: Once | INTRAMUSCULAR | Status: AC
  Administered 2012-12-19: 0.5 mL via INTRAMUSCULAR

## 2012-12-18 MED ORDER — PRENATAL MULTIVITAMIN CH
1.0000 | ORAL_TABLET | Freq: Every day | ORAL | Status: DC
  Administered 2012-12-19 – 2012-12-21 (×3): 1 via ORAL
  Filled 2012-12-18 (×3): qty 1

## 2012-12-18 MED ORDER — LIDOCAINE-EPINEPHRINE (PF) 2 %-1:200000 IJ SOLN
INTRAMUSCULAR | Status: AC
  Filled 2012-12-18: qty 40

## 2012-12-18 MED ORDER — FENTANYL 2.5 MCG/ML BUPIVACAINE 1/10 % EPIDURAL INFUSION (WH - ANES)
INTRAMUSCULAR | Status: DC | PRN
  Administered 2012-12-18: 14 mL/h via EPIDURAL

## 2012-12-18 MED ORDER — METOCLOPRAMIDE HCL 5 MG/ML IJ SOLN: 10.0000 mg | Freq: Three times a day (TID) | INTRAMUSCULAR | Status: DC | PRN

## 2012-12-18 MED ORDER — DIBUCAINE 1 % RE OINT: 1.0000 "application " | TOPICAL_OINTMENT | RECTAL | Status: DC | PRN

## 2012-12-18 MED ORDER — SODIUM CHLORIDE 0.9 % IJ SOLN: 3.0000 mL | INTRAMUSCULAR | Status: DC | PRN

## 2012-12-18 MED ORDER — MEASLES, MUMPS & RUBELLA VAC ~~LOC~~ INJ: 0.5000 mL | INJECTION | Freq: Once | SUBCUTANEOUS | Status: DC

## 2012-12-18 MED ORDER — DIPHENHYDRAMINE HCL 50 MG/ML IJ SOLN: 25.0000 mg | INTRAMUSCULAR | Status: DC | PRN

## 2012-12-18 MED ORDER — CEFAZOLIN SODIUM-DEXTROSE 2-3 GM-% IV SOLR
2.0000 g | Freq: Once | INTRAVENOUS | Status: AC
  Administered 2012-12-18: 2 g via INTRAVENOUS
  Filled 2012-12-18: qty 50

## 2012-12-18 MED ORDER — WITCH HAZEL-GLYCERIN EX PADS: 1.0000 "application " | MEDICATED_PAD | CUTANEOUS | Status: DC | PRN

## 2012-12-18 MED ORDER — LACTATED RINGERS IV SOLN
INTRAVENOUS | Status: DC
  Administered 2012-12-18: 17:00:00 via INTRAVENOUS

## 2012-12-18 MED ORDER — LACTATED RINGERS IV SOLN: INTRAVENOUS | Status: DC

## 2012-12-18 MED ORDER — MORPHINE SULFATE 0.5 MG/ML IJ SOLN
INTRAMUSCULAR | Status: AC
  Filled 2012-12-18: qty 10

## 2012-12-18 MED ORDER — KETOROLAC TROMETHAMINE 60 MG/2ML IM SOLN
INTRAMUSCULAR | Status: AC
  Filled 2012-12-18: qty 2

## 2012-12-18 MED ORDER — SIMETHICONE 80 MG PO CHEW: 80.0000 mg | CHEWABLE_TABLET | ORAL | Status: DC | PRN

## 2012-12-18 MED ORDER — PROMETHAZINE HCL 25 MG/ML IJ SOLN: 6.2500 mg | INTRAMUSCULAR | Status: DC | PRN

## 2012-12-18 MED ORDER — SODIUM BICARBONATE 8.4 % IV SOLN
INTRAVENOUS | Status: AC
  Filled 2012-12-18: qty 50

## 2012-12-18 MED ORDER — SCOPOLAMINE 1 MG/3DAYS TD PT72
1.0000 | MEDICATED_PATCH | Freq: Once | TRANSDERMAL | Status: AC
  Administered 2012-12-18: 1.5 mg via TRANSDERMAL

## 2012-12-18 MED ORDER — NALOXONE HCL 0.4 MG/ML IJ SOLN: 0.4000 mg | INTRAMUSCULAR | Status: DC | PRN

## 2012-12-18 MED ORDER — OXYTOCIN 40 UNITS IN LACTATED RINGERS INFUSION - SIMPLE MED: 62.5000 mL/h | INTRAVENOUS | Status: DC

## 2012-12-18 MED ORDER — ONDANSETRON HCL 4 MG PO TABS: 4.0000 mg | ORAL_TABLET | ORAL | Status: DC | PRN

## 2012-12-18 MED ORDER — TETANUS-DIPHTH-ACELL PERTUSSIS 5-2.5-18.5 LF-MCG/0.5 IM SUSP: 0.5000 mL | Freq: Once | INTRAMUSCULAR | Status: DC

## 2012-12-18 MED ORDER — BUPIVACAINE-EPINEPHRINE (PF) 0.5% -1:200000 IJ SOLN
INTRAMUSCULAR | Status: AC
  Filled 2012-12-18: qty 10

## 2012-12-18 MED ORDER — PRENATAL MULTIVITAMIN CH: 1.0000 | ORAL_TABLET | Freq: Every morning | ORAL | Status: DC

## 2012-12-18 MED ORDER — ONDANSETRON HCL 4 MG/2ML IJ SOLN: 4.0000 mg | Freq: Three times a day (TID) | INTRAMUSCULAR | Status: DC | PRN

## 2012-12-18 MED ORDER — LIDOCAINE HCL (PF) 1 % IJ SOLN
INTRAMUSCULAR | Status: DC | PRN
  Administered 2012-12-18 (×2): 9 mL

## 2012-12-18 MED ORDER — MORPHINE SULFATE (PF) 0.5 MG/ML IJ SOLN
INTRAMUSCULAR | Status: DC | PRN
  Administered 2012-12-18: .2 mg via INTRATHECAL

## 2012-12-18 MED ORDER — TRAMADOL HCL 50 MG PO TABS
100.0000 mg | ORAL_TABLET | Freq: Four times a day (QID) | ORAL | Status: DC | PRN
  Administered 2012-12-19 – 2012-12-21 (×6): 100 mg via ORAL
  Filled 2012-12-18 (×2): qty 2
  Filled 2012-12-18 (×3): qty 1
  Filled 2012-12-18: qty 2
  Filled 2012-12-18: qty 1
  Filled 2012-12-18: qty 2
  Filled 2012-12-18 (×2): qty 1

## 2012-12-18 MED ORDER — SENNOSIDES-DOCUSATE SODIUM 8.6-50 MG PO TABS
2.0000 | ORAL_TABLET | Freq: Every day | ORAL | Status: DC
  Administered 2012-12-19 – 2012-12-20 (×2): 2 via ORAL

## 2012-12-18 MED ORDER — TERBUTALINE SULFATE 1 MG/ML IJ SOLN: 0.2500 mg | Freq: Once | INTRAMUSCULAR | Status: DC | PRN

## 2012-12-18 MED ORDER — IBUPROFEN 600 MG PO TABS: 600.0000 mg | ORAL_TABLET | Freq: Four times a day (QID) | ORAL | Status: DC

## 2012-12-18 MED ORDER — ONDANSETRON HCL 4 MG/2ML IJ SOLN
INTRAMUSCULAR | Status: DC | PRN
  Administered 2012-12-18: 4 mg via INTRAVENOUS

## 2012-12-18 MED ORDER — PHENYLEPHRINE HCL 10 MG/ML IJ SOLN
INTRAMUSCULAR | Status: DC | PRN
  Administered 2012-12-18: 80 ug via INTRAVENOUS

## 2012-12-18 MED ORDER — DIPHENHYDRAMINE HCL 50 MG/ML IJ SOLN: 12.5000 mg | INTRAMUSCULAR | Status: DC | PRN

## 2012-12-18 MED ORDER — SODIUM CHLORIDE 0.9 % IR SOLN
Status: DC | PRN
  Administered 2012-12-18: 1000 mL

## 2012-12-18 MED ORDER — KETOROLAC TROMETHAMINE 60 MG/2ML IM SOLN
60.0000 mg | Freq: Once | INTRAMUSCULAR | Status: AC | PRN
  Administered 2012-12-18: 60 mg via INTRAMUSCULAR

## 2012-12-18 MED ORDER — LIDOCAINE-EPINEPHRINE (PF) 2 %-1:200000 IJ SOLN
INTRAMUSCULAR | Status: AC
  Filled 2012-12-18: qty 20

## 2012-12-18 MED ORDER — OXYCODONE-ACETAMINOPHEN 5-325 MG PO TABS: 1.0000 | ORAL_TABLET | ORAL | Status: DC | PRN

## 2012-12-18 MED ORDER — MEPERIDINE HCL 25 MG/ML IJ SOLN
INTRAMUSCULAR | Status: DC | PRN
  Administered 2012-12-18: 12.5 mg via INTRAVENOUS

## 2012-12-18 MED ORDER — ONDANSETRON HCL 4 MG/2ML IJ SOLN: 4.0000 mg | INTRAMUSCULAR | Status: DC | PRN

## 2012-12-18 MED ORDER — MORPHINE SULFATE (PF) 0.5 MG/ML IJ SOLN
INTRAMUSCULAR | Status: DC | PRN
  Administered 2012-12-18: .5 ug via EPIDURAL
  Administered 2012-12-18: 2 ug via INTRAVENOUS
  Administered 2012-12-18: .5 ug via EPIDURAL

## 2012-12-18 MED ORDER — NALBUPHINE HCL 10 MG/ML IJ SOLN
5.0000 mg | INTRAMUSCULAR | Status: DC | PRN
  Filled 2012-12-18: qty 1

## 2012-12-18 SURGICAL SUPPLY — 45 items
CLOTH BEACON ORANGE TIMEOUT ST (SAFETY) ×2 IMPLANT
CONTAINER PREFILL 10% NBF 15ML (MISCELLANEOUS) IMPLANT
DRAIN JACKSON PRT FLT 7MM (DRAIN) IMPLANT
DRAPE LG THREE QUARTER DISP (DRAPES) ×2 IMPLANT
DRSG OPSITE POSTOP 4X10 (GAUZE/BANDAGES/DRESSINGS) ×2 IMPLANT
DURAPREP 26ML APPLICATOR (WOUND CARE) ×2 IMPLANT
ELECT REM PT RETURN 9FT ADLT (ELECTROSURGICAL) ×2
ELECTRODE REM PT RTRN 9FT ADLT (ELECTROSURGICAL) ×1 IMPLANT
EVACUATOR SILICONE 100CC (DRAIN) IMPLANT
EXTRACTOR VACUUM M CUP 4 TUBE (SUCTIONS) IMPLANT
GLOVE BIOGEL PI IND STRL 8.5 (GLOVE) ×1 IMPLANT
GLOVE BIOGEL PI INDICATOR 8.5 (GLOVE) ×1
GLOVE ECLIPSE 8.0 STRL XLNG CF (GLOVE) ×2 IMPLANT
GLOVE SKINSENSE NS SZ6.5 (GLOVE) ×3
GLOVE SKINSENSE NS SZ8.0 LF (GLOVE) ×1
GLOVE SKINSENSE STRL SZ6.5 (GLOVE) IMPLANT
GLOVE SKINSENSE STRL SZ8.0 LF (GLOVE) IMPLANT
GOWN PREVENTION PLUS XXLARGE (GOWN DISPOSABLE) ×2 IMPLANT
GOWN STRL REIN XL XLG (GOWN DISPOSABLE) ×4 IMPLANT
KIT ABG SYR 3ML LUER SLIP (SYRINGE) IMPLANT
NDL HYPO 25X1 1.5 SAFETY (NEEDLE) ×1 IMPLANT
NDL HYPO 25X5/8 SAFETYGLIDE (NEEDLE) IMPLANT
NEEDLE HYPO 25X1 1.5 SAFETY (NEEDLE) ×2 IMPLANT
NEEDLE HYPO 25X5/8 SAFETYGLIDE (NEEDLE) IMPLANT
PACK C SECTION WH (CUSTOM PROCEDURE TRAY) ×2 IMPLANT
PAD ABD 7.5X8 STRL (GAUZE/BANDAGES/DRESSINGS) ×1 IMPLANT
PAD OB MATERNITY 4.3X12.25 (PERSONAL CARE ITEMS) ×2 IMPLANT
RINGERS IRRIG 1000ML POUR BTL (IV SOLUTION) ×2 IMPLANT
SLEEVE SCD COMPRESS KNEE MED (MISCELLANEOUS) IMPLANT
STAPLER VISISTAT 35W (STAPLE) IMPLANT
SUT MNCRL AB 3-0 PS2 27 (SUTURE) ×1 IMPLANT
SUT PLAIN 0 NONE (SUTURE) IMPLANT
SUT SILK 3 0 FS 1X18 (SUTURE) IMPLANT
SUT VIC AB 0 CT1 27 (SUTURE) ×4
SUT VIC AB 0 CT1 27XBRD ANBCTR (SUTURE) ×2 IMPLANT
SUT VIC AB 2-0 CTX 36 (SUTURE) ×4 IMPLANT
SUT VIC AB 3-0 CT1 27 (SUTURE)
SUT VIC AB 3-0 CT1 TAPERPNT 27 (SUTURE) IMPLANT
SUT VIC AB 3-0 SH 27 (SUTURE)
SUT VIC AB 3-0 SH 27X BRD (SUTURE) IMPLANT
SYR CONTROL 10ML LL (SYRINGE) ×2 IMPLANT
TAPE CLOTH SURG 4X10 WHT LF (GAUZE/BANDAGES/DRESSINGS) ×1 IMPLANT
TOWEL OR 17X24 6PK STRL BLUE (TOWEL DISPOSABLE) ×6 IMPLANT
TRAY FOLEY CATH 14FR (SET/KITS/TRAYS/PACK) ×1 IMPLANT
WATER STERILE IRR 1000ML POUR (IV SOLUTION) ×2 IMPLANT

## 2012-12-18 NOTE — Progress Notes (Signed)
  Subjective: Comfortable with epidural.  Objective: BP 118/48  Pulse 90  Temp(Src) 98.2 F (36.8 C) (Oral)  Resp 18  Ht 5' 5.5" (1.664 m)  Wt 179 lb (81.194 kg)  BMI 29.32 kg/m2  SpO2 99%  LMP 03/24/2012      FHT:  Category 1  UC:   regular, every 2-3 minutes Pitocin on 2 mu/min SVE:   Dilation: 7 Effacement (%): 80 Station: -1 Exam by:: Emilee Hero CNM Cervix less edematous now. Had been in exaggerated Sims.   Assessment / Plan: Inadequate labor Continue pitocin augmentation  Katherine Cooley 12/18/2012, 6:49 AM

## 2012-12-18 NOTE — Progress Notes (Signed)
Manfred Arch CNM notified that patient states when she has taken percocet or vicodin her heart races and she feels jittery

## 2012-12-18 NOTE — Progress Notes (Signed)
  Subjective: Comfortable with epidural.  Objective: BP 124/57  Pulse 87  Temp(Src) 98.3 F (36.8 C) (Oral)  Resp 18  Ht 5' 5.5" (1.664 m)  Wt 179 lb (81.194 kg)  BMI 29.32 kg/m2  SpO2 99%  LMP 03/24/2012      FHT:  Category 1 UC:   irregular, every 3-5 minutes SVE:   6 cm, 80%, with edematous anterior portion of the cervix.  Pelvis feels adequate. IUPC placed easily.  Assessment / Plan: Slow progress in active phase, likely due to inadequate contractions Will augment with pitocin per low-dose protocol Position patient to facilitate rotation and descent.  [name] 12/18/2012, 4:36 AM

## 2012-12-18 NOTE — Progress Notes (Signed)
  Subjective: Called to BS by RN d/t pt having more c/o pain.  Pt still feeling breakthrough ctx with epidural. Denies rectal pressure.  Objective: BP 112/63  Pulse 87  Temp(Src) 98.5 F (36.9 C) (Oral)  Resp 20  Ht 5' 5.5" (1.664 m)  Wt 179 lb (81.194 kg)  BMI 29.32 kg/m2  SpO2 97%  LMP 03/24/2012 I/O last 3 completed shifts: In: -  Out: 325 [Urine:325]   FHT:  Cat I UC:   regular, every 2-4 minutes MVU: 55-60 Pitocin: 3  SVE@ 0857: 7-8 with thicker anterior cx / 80% / 0 / bloody show  Assessment / Plan: Active labor Breakthrough pain  CTO labor progress Continue pitocin protocol RN to notify anesthesia of pain Position change to encourage cx dilation  Katherine Cooley 12/18/2012, 9:39 AM

## 2012-12-18 NOTE — Progress Notes (Signed)
TC from [place], [place]. Patient reports Percocet and Vicodin have given her a racing heartbeat in past. Will Rx Ultram for prn use for moderate pain. Allergy status updated to reflect patient hx of sx with Percocet and Vicodin use.  [name], CNM 12/18/12 8:43p

## 2012-12-18 NOTE — Progress Notes (Signed)
  Subjective: Comfortable with epidural.  Family at bedside.  Objective: BP 120/75  Pulse 94  Temp(Src) 97.9 F (36.6 C) (Axillary)  Resp 18  Ht 5' 5.5" (1.664 m)  Wt 179 lb (81.194 kg)  BMI 29.32 kg/m2  SpO2 99%  LMP 03/24/2012      FHT: Category 1 UC:   regular, every 4 minutes SVE:   Dilation: 5.5 Effacement (%): 100 Station: -1 Exam by:: Emilee Hero CNM Leaking clear fluid--evident SROM over last hour since epidural placed.  Assessment / Plan: Progressive labor Will CTO progress--recheck within next 2 hours for status. Augment prn.  Katherine Cooley 12/18/2012, 2:23 AM

## 2012-12-18 NOTE — Progress Notes (Addendum)
  Subjective: Pt currently has an epidural but is c/o breakthrough pain.  Otherwise she reports being "fine."  Spoke with pt about any expectations she has of her labor or delivery, and answered questions.  Discussed POC, pt agreeable.  Objective: BP 131/78  Pulse 91  Temp(Src) 98.1 F (36.7 C) (Oral)  Resp 18  Ht 5' 5.5" (1.664 m)  Wt 179 lb (81.194 kg)  BMI 29.32 kg/m2  SpO2 98%  LMP 03/24/2012 I/O last 3 completed shifts: In: -  Out: 325 [Urine:325]   FHT:  Cat I UC:   regular, every 2-4 minutes MVU: 75 - 80 Pitocin: 3  SVE @ 0640: 7 / 80 / -1 by VL  Assessment / Plan: IUP at [redacted]w[redacted]d Active labor Epidural GBS Neg A pos  CTO labor progress Continue pitocin protocol RN notified of breakthrough pain C/w Dr. Stefano Gaul as needed  Katherine Cooley 12/18/2012, 9:22 AM

## 2012-12-18 NOTE — Progress Notes (Signed)
Cx: 5/25/-1,-2 Swollen anterior lip CS recommended. R and B discussed. Pt agrees. NST: Cat 2  Dr. Stefano Gaul

## 2012-12-18 NOTE — Transfer of Care (Signed)
Immediate Anesthesia Transfer of Care Note  Patient: Katherine Cooley  Procedure(s) Performed: Procedure(s):  Primary cesarean section with delivery of baby boy at 67. Apgars 8/9. (N/A)  Patient Location: PACU  Anesthesia Type:Spinal  Level of Consciousness: awake, alert  and oriented  Airway & Oxygen Therapy: Patient Spontanous Breathing  Post-op Assessment: Report given to PACU RN and Post -op Vital signs reviewed and stable  Post vital signs: Reviewed and stable  Complications: No apparent anesthesia complications

## 2012-12-18 NOTE — Anesthesia Postprocedure Evaluation (Signed)
  Anesthesia Post-op Note  Patient: Katherine Cooley  Procedure(s) Performed: Procedure(s) (LRB):  Primary cesarean section with delivery of baby boy at 89. Apgars 8/9. (N/A)  Patient Location: PACU  Anesthesia Type: Spinal  Level of Consciousness: awake and alert   Airway and Oxygen Therapy: Patient Spontanous Breathing  Post-op Pain: mild  Post-op Assessment: Post-op Vital signs reviewed, Patient's Cardiovascular Status Stable, Respiratory Function Stable, Patent Airway and No signs of Nausea or vomiting  Last Vitals:  Filed Vitals:   12/18/12 1545  BP: 128/71  Pulse: 105  Temp:   Resp: 20    Post-op Vital Signs: stable   Complications: No apparent anesthesia complications

## 2012-12-18 NOTE — OR Nursing (Addendum)
Uterus massaged by S. Kirandeep Fariss Charity fundraiser. Two tubes of cord blood sent to lab.  Foley catheter in upon arrival to OR. Urine color-blood tinged.  75 cc  of blood evacuated from uterus during uterine massage.

## 2012-12-18 NOTE — Op Note (Signed)
OPERATIVE NOTE  Patient's Name: Katherine Cooley  Date of Birth: [birth year]   Medical Records Number: [id number]   Date of Operation: 12/18/2012   Preoperative diagnosis:  105w3d weeks gestation  failure to progress  Postoperative diagnosis:  [redacted]w[redacted]d weeks gestation  failure to progress  Occiput Transverse Presentation  Procedure:  Low Transverse Cesarean Section  Surgeon:  Leonard Schwartz, M.D.  Assistant:  Haroldine Laws, certified nurse midwife  Anesthesia:  Regional  Disposition:  KARMIN KASPRZAK is a 32 y.o. female, gravida 2 para 0-0-1-0, who presents at [redacted]w[redacted]d weeks gestation. The patient has been followed at the [facility] and Gynecology division of [facility]. The patient failed to progress in labor. The cervix began to swell. She understands the indications for her procedure and she accepts the risk of, but not limited to, anesthetic complications, bleeding, infections, and possible damage to the surrounding organs.  Findings:  A female [name]) was delivered from a OT position.  The Apgar scores were 8/9. The uterus, fallopian tubes, and ovaries were normal for the gravid state. A benign appearing cyst was noted on the right ovary.  Procedure:  The patient was taken to the operating room where it was determined that the epidural she had her labor would not be adequate for cesarean delivery. A spinal was placed.The patient's abdomen was prepped with Chloraprep.  A Foley catheter was previously placed in the bladder. The patient was sterilely draped. The lower abdomen was injected with half percent Marcaine with epinephrine. A low transverse incision was made in the abdomen and carried sharply through the subcutaneous tissue, the fascia, and the anterior peritoneum. An incision was made in the lower uterine segment. The incision was extended in a low transverse fashion. The membranes were ruptured. The fetal head was  delivered without difficulty. The mouth and nose were suctioned. The remainder of the infant was then delivered. The cord was clamped and cut. The infant was handed to the awaiting pediatric team. The placenta was removed. The uterine cavity was cleaned of amniotic fluid, clotted blood, and membranes. The uterine incision was closed using a running locking suture of 2-0 Vicryl. An imbricating suture of 2-0 Vicryl was placed. The pelvis was vigorously irrigated. Hemostasis was adequate. The anterior peritoneum and the abdominal musculature were closed using 2-0 Vicryl. The fascia was closed using a running suture of 0 Vicryl followed by 3 interrupted sutures of 0 Vicryl. The subcutaneous layer was closed using interrupted sutures of 2-0 Vicryl. The skin was reapproximated using a subcuticular suture of 3-0 Monocryl. Sponge, needle, and instrument counts were correct on 2 occasions. The estimated blood loss for the procedure was 600 cc. The patient tolerated her procedure well. She was transported to the recovery room in stable condition. The infant was taken to the full-term nursery in stable condition. The placenta was sent to pathology.  Leonard Schwartz, M.D.

## 2012-12-18 NOTE — Anesthesia Preprocedure Evaluation (Addendum)
Anesthesia Evaluation  Patient identified by MRN, date of birth, ID band Patient awake    Reviewed: Allergy & Precautions, H&P , NPO status , Patient's Chart, lab work & pertinent test results  Airway Mallampati: I TM Distance: >3 FB Neck ROM: full    Dental no notable dental hx.    Pulmonary neg pulmonary ROS,    Pulmonary exam normal       Cardiovascular negative cardio ROS  + Valvular Problems/Murmurs     Neuro/Psych negative neurological ROS  negative psych ROS   GI/Hepatic negative GI ROS, Neg liver ROS,   Endo/Other  negative endocrine ROS  Renal/GU negative Renal ROS  negative genitourinary   Musculoskeletal negative musculoskeletal ROS (+)   Abdominal Normal abdominal exam  (+)   Peds negative pediatric ROS (+)  Hematology negative hematology ROS (+)   Anesthesia Other Findings   Reproductive/Obstetrics (+) Pregnancy                          Anesthesia Physical Anesthesia Plan  ASA: II and emergent  Anesthesia Plan: Epidural   Post-op Pain Management:    Induction:   Airway Management Planned:   Additional Equipment:   Intra-op Plan:   Post-operative Plan:   Informed Consent: I have reviewed the patients History and Physical, chart, labs and discussed the procedure including the risks, benefits and alternatives for the proposed anesthesia with the patient or authorized representative who has indicated his/her understanding and acceptance.     Plan Discussed with:   Anesthesia Plan Comments:        Anesthesia Quick Evaluation

## 2012-12-18 NOTE — Progress Notes (Signed)
  Subjective: Sleeping at intervals.  Objective: BP 126/71  Pulse 85  Temp(Src) 98.2 F (36.8 C) (Oral)  Resp 18  Ht 5' 5.5" (1.664 m)  Wt 179 lb (81.194 kg)  BMI 29.32 kg/m2  SpO2 99%  LMP 03/24/2012      FHT: Category 1 UC:   regular, every q 3 minutes MVUs 70-120 Pitocin on 2 mu/min.  Assessment / Plan: Inadequate labor Continue pitocin augmentation to establish adequacy.  Katherine Cooley 12/18/2012, 6:14 AM

## 2012-12-18 NOTE — Anesthesia Procedure Notes (Addendum)
Epidural Patient location during procedure: OB Start time: 12/18/2012 12:40 AM End time: 12/18/2012 12:44 AM  Staffing Anesthesiologist: Sandrea Hughs Performed by: anesthesiologist   Preanesthetic Checklist Completed: patient identified, site marked, surgical consent, pre-op evaluation, timeout performed, IV checked, risks and benefits discussed and monitors and equipment checked  Epidural Patient position: sitting Prep: site prepped and draped and DuraPrep Patient monitoring: continuous pulse ox and blood pressure Approach: midline Injection technique: LOR air  Needle:  Needle type: Tuohy  Needle gauge: 17 G Needle length: 9 cm and 9 Needle insertion depth: 5 cm cm Catheter type: closed end flexible Catheter size: 19 Gauge Catheter at skin depth: 10 cm Test dose: negative and Other  Assessment Sensory level: T8 Events: blood not aspirated, injection not painful, no injection resistance, negative IV test and no paresthesia  Additional Notes Reason for block:procedure for pain  Spinal  Patient location during procedure: OR Staffing Anesthesiologist: Phillips Grout Performed by: anesthesiologist  Preanesthetic Checklist Completed: patient identified, site marked, surgical consent, pre-op evaluation, timeout performed, IV checked, risks and benefits discussed and monitors and equipment checked Spinal Block Patient position: sitting Prep: Betadine Patient monitoring: heart rate, continuous pulse ox and blood pressure Approach: midline Location: L2-3 Injection technique: single-shot Needle Needle type: Sprotte  Needle gauge: 24 G Needle length: 9 cm Assessment Sensory level: T4 Additional Notes Existing epidural dosed. Not adequate for c/s. Removed. Tip intact. Covert to SAB  Expiration date of kit checked and confirmed. Patient tolerated procedure well, without complications.

## 2012-12-19 ENCOUNTER — Encounter (HOSPITAL_COMMUNITY): Payer: Self-pay | Admitting: Obstetrics and Gynecology

## 2012-12-19 DIAGNOSIS — Z98891 History of uterine scar from previous surgery: Secondary | ICD-10-CM

## 2012-12-19 LAB — CBC
MCHC: 34 g/dL (ref 30.0–36.0)
Platelets: 198 10*3/uL (ref 150–400)
RDW: 13.8 % (ref 11.5–15.5)

## 2012-12-19 NOTE — Progress Notes (Signed)
Ur chart review completed.  

## 2012-12-19 NOTE — Anesthesia Postprocedure Evaluation (Signed)
  Anesthesia Post-op Note  Patient: Katherine Cooley  Procedure(s) Performed: Procedure(s):  Primary cesarean section with delivery of baby boy at 64. Apgars 8/9. (N/A)  Patient Location: Mother/Baby  Anesthesia Type:Epidural  Level of Consciousness: awake, alert  and oriented  Airway and Oxygen Therapy: Patient Spontanous Breathing  Post-op Pain: mild  Post-op Assessment: Patient's Cardiovascular Status Stable and Respiratory Function Stable  Post-op Vital Signs: stable  Complications: No apparent anesthesia complications

## 2012-12-19 NOTE — Anesthesia Postprocedure Evaluation (Signed)
  Anesthesia Post-op Note  Patient: Katherine Cooley  Procedure(s) Performed: Procedure(s):  Primary cesarean section with delivery of baby boy at 48. Apgars 8/9. (N/A)  Patient Location: Mother/Baby  Anesthesia Type:Spinal  Level of Consciousness: awake, alert , oriented and patient cooperative  Airway and Oxygen Therapy: Patient Spontanous Breathing  Post-op Pain: mild  Post-op Assessment: Patient's Cardiovascular Status Stable and Respiratory Function Stable  Post-op Vital Signs: stable  Complications: No apparent anesthesia complications

## 2012-12-19 NOTE — Progress Notes (Signed)
Patient ID: Katherine Cooley, female   DOB: January 30, 1981, 32 y.o.   MRN: [id number] Subjective: Postpartum Day 1: Cesarean Delivery secondary to: FTP Patient reports tolerating PO, + flatus and no problems voiding.   no complaints, up ad lib without syncope Pain well controlled with po meds Breast and bottle per pt request Mood stable, bonding well Contraception: micronor   Objective: Vital signs in last 24 hours: Temp:  [98.2 F (36.8 C)-99.9 F (37.7 C)] 98.5 F (36.9 C) (04/21 0930) Pulse Rate:  [84-117] 84 (04/21 0930) Resp:  [18-22] 18 (04/21 0930) BP: (112-137)/(50-83) 122/82 mmHg (04/21 0930) SpO2:  [96 %-100 %] 99 % (04/21 0930)  Physical Exam:  General: no distress Heart: RRR Lungs: CTAB Abdomen: BS x4 Uterine Fundus: firm Incision: dressing CDI   Lochia: appropriate DVT Evaluation: No evidence of DVT seen on physical exam. Negative Homan's sign. No significant calf/ankle edema.   Recent Labs  12/17/12 2240 12/19/12 0945  HGB 11.6* 8.5*  HCT 34.0* 25.0*    Assessment/Plan: Status post Cesarean section. Doing well postoperatively.  Continue current care. Anemia, will check orthostatics, order FE supplement    Kevyn Wengert M 12/19/2012, 11:32 AM

## 2012-12-20 ENCOUNTER — Encounter (HOSPITAL_COMMUNITY): Payer: Self-pay | Admitting: *Deleted

## 2012-12-20 NOTE — Progress Notes (Signed)
Subjective: Postpartum Day 2: Cesarean Delivery Patient reports tolerating PO, + flatus and no problems voiding.  VB minimal.  No BM yet.  No dizziness when up.  Gave newborn some formula this morning, but only time thus far.  Working on [employer].  Planning outpt circ and interested in DMPA.  Showered yesterday.  Objective: Vital signs in last 24 hours: Temp:  [98.2 F (36.8 C)-98.9 F (37.2 C)] 98.5 F (36.9 C) (04/22 0600) Pulse Rate:  [84-101] 88 (04/22 0600) Resp:  [18] 18 (04/22 0600) BP: (121-130)/(77-84) 121/77 mmHg (04/22 0600) SpO2:  [98 %] 98 % (04/21 1330)  Physical Exam:  General: alert, cooperative, appears stated age and no distress Lochia: appropriate, rubra, light Uterine Fundus: firm, below umbilicus Incision: honeycomb dsg w/ underlying old dried brown drainage; no active bleeding; appropriately tender DVT Evaluation: No evidence of DVT seen on physical exam. Negative Homan's sign. No significant calf/ankle edema.   Recent Labs  12/17/12 2240 12/19/12 0945  HGB 11.6* 8.5*  HCT 34.0* 25.0*    Assessment/Plan: Status post Cesarean section for FTP. Postoperative course complicated by mild PP anemia  Continue current care.  Continue working on [employer].  D/C tomorrow.  Katherine Cooley 12/20/2012, 10:53 AM

## 2012-12-21 ENCOUNTER — Encounter (HOSPITAL_COMMUNITY): Payer: Self-pay

## 2012-12-21 MED ORDER — MEDROXYPROGESTERONE ACETATE 150 MG/ML IM SUSP: 150.0000 mg | INTRAMUSCULAR | Status: DC

## 2012-12-21 MED ORDER — IBUPROFEN 600 MG PO TABS: 600.0000 mg | ORAL_TABLET | Freq: Four times a day (QID) | ORAL | Status: DC

## 2012-12-21 MED ORDER — TRAMADOL HCL 50 MG PO TABS: 100.0000 mg | ORAL_TABLET | Freq: Four times a day (QID) | ORAL | Status: DC | PRN

## 2012-12-21 NOTE — Discharge Summary (Signed)
Obstetric Discharge Summary Reason for Admission: onset of labor Prenatal Procedures: ultrasound Intrapartum Procedures: cesarean: low cervical, transverse and epidural Postpartum Procedures: none Complications-Operative and Postpartum: none Hemoglobin  Date Value Range Status  12/19/2012 8.5* 12.0 - 15.0 g/dL Final     DELTA CHECK NOTED     REPEATED TO VERIFY     HCT  Date Value Range Status  12/19/2012 25.0* 36.0 - 46.0 % Final    Physical Exam:  General: alert, cooperative, appears stated age and no distress Lochia: appropriate Uterine Fundus: firm Incision: healing well, no significant drainage DVT Evaluation: No evidence of DVT seen on physical exam. Negative Homan's sign. No significant calf/ankle edema.  Hospital course: Pt admitted in active labor, late evening of 12/17/12 at [redacted]w[redacted]d at 4cm dilatation.  Shortly after MN and transfer to [facility] suites, pt received epidural.  On next exam after getting comfortable, clear LOF noted and SROM had occurred since epidural placed.  Cx 6cm around 0430 and IUPC placed.  Pitocin given for augmentation and as time progressed, cx dilated to around 7cm, but began to swell and retract.  C/s was recommended on afternoon of 12/18/12 after cx noted to be 5-6 cm and swollen.  Procedure was performed by AVS, MD, and pt and newborn tolerated well; fetus noted to be in OT presentation at time of delivery.  Pt's PP course was uncomplicated.  She utilized Ultram instead of percocet secondary to previous h/o percocet causing tachycardia.  She was hemodynamically stable despite PP anemia.  Undecided on [facility]. Struggled initially w/ BF'ng but continued working at it during stay.  Discharge Diagnoses: Term Pregnancy-delivered and mild PP anemia; Lactating; h/o cyro; h/o dermoid cyst. s/p primary LTCS for FTP & OT presentation.  Discharge Information: Date: 12/21/2012 POD#3 Activity: pelvic rest Diet: iron rich Medications: PNV, Ibuprofen, Colace and  ultram Condition: stable Instructions: refer to practice specific booklet Discharge to: home Follow-up Information   Follow up with [facility]necology. Schedule an appointment as soon as possible for a visit in 6 weeks. (for your postpartum visit, or call as needed with any questions or concerns)    Contact information:   [address] [place] [address] [phone]      Schedule an appointment as soon as possible for a visit with [facility]. (as soon as possible, and at least before baby is one 79 old, to come to office for his circumcision)    Contact information:   [address] [place] [address] [phone]      Newborn Data: Live born female "[name]" (delivery providers: Dr. Leonard Schwartz, MD & Haroldine Laws, CNM) Birth Weight: 7 lb 7.2 oz (3378 g) APGAR: 8, 9  Home with mother.  Lovina Zuver H 12/21/2012, 8:08 AM

## 2012-12-21 NOTE — Discharge Instructions (Signed)
Iron-Rich Diet An iron-rich diet contains foods that are good sources of iron. Iron is an important mineral that helps your body produce hemoglobin. Hemoglobin is a protein in red blood cells that carries oxygen to the body's tissues. Sometimes, the iron level in your blood can be low. This may be caused by:  A lack of iron in your diet.  Blood loss.  Times of growth, such as during pregnancy or during a child's growth and development. Low levels of iron can cause a decrease in the number of red blood cells. This can result in iron deficiency anemia. Iron deficiency anemia symptoms include:  Tiredness.  Weakness.  Irritability.  Increased chance of infection. Here are some recommendations for daily iron intake:  Males older than 31 years of age need 8 mg of iron per day.  Women ages 78 to 75 need 18 mg of iron per day.  Pregnant women need 27 mg of iron per day, and women who are over 66 years of age and breastfeeding need 9 mg of iron per day.  Women over the age of 23 need 8 mg of iron per day. SOURCES OF IRON There are 2 types of iron that are found in food: heme iron and nonheme iron. Heme iron is absorbed by the body better than nonheme iron. Heme iron is found in meat, poultry, and fish. Nonheme iron is found in grains, beans, and vegetables. Heme Iron Sources Food / Iron (mg)  Chicken liver, 3 oz (85 g)/ 10 mg  Beef liver, 3 oz (85 g)/ 5.5 mg  Oysters, 3 oz (85 g)/ 8 mg  Beef, 3 oz (85 g)/ 2 to 3 mg  Shrimp, 3 oz (85 g)/ 2.8 mg  [place], 3 oz (85 g)/ 2 mg  Chicken, 3 oz (85 g) / 1 mg  Fish (tuna, halibut), 3 oz (85 g)/ 1 mg  Pork, 3 oz (85 g)/ 0.9 mg Nonheme Iron Sources Food / Iron (mg)  Ready-to-eat breakfast cereal, iron-fortified / 3.9 to 7 mg  Tofu,  cup / 3.4 mg  Kidney beans,  cup / 2.6 mg  Baked potato with skin / 2.7 mg  Asparagus,  cup / 2.2 mg  Avocado / 2 mg  Dried peaches,  cup / 1.6 mg  Raisins,  cup / 1.5 mg  Soy milk, 1 cup  / 1.5 mg  Whole-wheat bread, 1 slice / 1.2 mg  Spinach, 1 cup / 0.8 mg  Broccoli,  cup / 0.6 mg IRON ABSORPTION Certain foods can decrease the body's absorption of iron. Try to avoid these foods and beverages while eating meals with iron-containing foods:  Coffee.  Tea.  Fiber.  Soy. Foods containing vitamin C can help increase the amount of iron your body absorbs from iron sources, especially from nonheme sources. Eat foods with vitamin C along with iron-containing foods to increase your iron absorption. Foods that are high in vitamin C include many fruits and vegetables. Some good sources are:  Fresh orange juice.  Oranges.  Strawberries.  Mangoes.  Grapefruit.  Red bell peppers.  Green bell peppers.  Broccoli.  Potatoes with skin.  Tomato juice. Document Released: 03/31/2005 Document Revised: 11/09/2011 Document Reviewed: 02/05/2011 [facility] Patient Information 2013 [place], [place]. Cesarean Delivery Care After Refer to this sheet in the next few weeks. These instructions provide you with information on caring for yourself after your procedure. Your caregiver may also give you more specific instructions. Your treatment has been planned according to current medical practices,  but problems sometimes occur. Call your caregiver if you have any problems or questions after your procedure. HOME CARE INSTRUCTIONS Healing will take time. You will have discomfort, tenderness, swelling, and bruising at the surgery site for a couple of weeks. This is normal and will get better as time goes on. Activity  Rest as much as possible the first 2 weeks.  When possible, have someone help you with your household activities and your baby for 2 to 3 weeks.  Limit your housework and social activity. Increase your activity gradually as your strength returns.  Do not climb stairs more than 2 to 3 times a day.  Do not lift anything heavier than your baby.  Follow your caregiver's  instructions about driving a car.  Exercise only as directed by your caregiver. Nutrition  You may return to your usual diet. Eat a well-balanced diet.  Drink enough fluid to keep your urine clear or pale yellow.  Keep taking your prenatal or multivitamins.  Do not drink alcohol until your caregiver says it is okay. Elimination You should return to your usual bowel function. If you develop constipation, ask your caregiver about taking a mild laxative that will help you go to the bathroom. Bran foods and fluids help with constipation. Gradually add fruit, vegetables, and bran to your diet.  Hygiene  You may shower, wash your hair, and take tub baths unless your caregiver tells you otherwise.  Continue perineal care until your vaginal bleeding and discharge stops.  Do not douche or use tampons until your caregiver says it is okay. Fever If you feel feverish or have shaking chills, take your temperature. The fever may indicate infection. Infections can be treated with antibiotic medicine. Pain Control and Medicine  Only take over-the-counter or prescription medicine as directed by your caregiver. Do not take aspirin. It can cause bleeding.  Do not drive when taking pain medicine.  Talk to your caregiver about restarting or adjusting your normal medicines. Incision Care  Clean your cut (incision) gently with soap and water, then pat dry.  If your caregiver says it is okay, leave the incision without a bandage (dressing) unless it is draining fluid or irritated.  If you have small adhesive strips across the incision and they do not fall off within 7 days, carefully peel them off.  Check the incision daily for increased redness, drainage, swelling, or separation of skin.  Hug a pillow when coughing or sneezing. This helps to relieve pain. Vaginal Care You may have a vaginal discharge or bleeding for up to 6 weeks. If the vaginal discharge becomes bright red, bad smelling, heavy in  amount, has blood clots, or if you have burning or frequent urination, call your caregiver.If your bleeding slows down and then gets heavier, your body is telling you to slow down and relax more. Sexual Intercourse  Check with your caregiver before resuming sexual activity. Often, after 4 to 6 weeks, if you feel good and are well rested, sexual activity may be resumed. Avoid positions that strain the incision site.  You can become pregnant before you have a period. If you decide to have sexual intercourse, use birth control if you do not want to become pregnant right away. Health Practices  Keep all your postpartum appointments as recommended by your caregiver. Generally, your caregiver will want to see you in 2 to 3 weeks.  Continue with your yearly pelvic exams.  Continue monthly self-breast exams and yearly physical exams with a Pap test. Breast  Care  If you are not breastfeeding and your breasts become tender, hard, or leak milk, you may wear a tight-fitting bra and apply ice to your breasts.  If you are breastfeeding, wear a good support bra.  Call your caregiver if you have breast pain, flu-like symptoms, fever, or hardness and reddening of your breasts. Postpartum Blues You may have a period of low spirits or "blues" after your baby is born. Discuss your feelings with your partner, family, and friends. This may be caused by the changing hormone levels in your body. You may want to contact your caregiver if this is worrisome. Miscellaneous  Limit wearing support panties or control-top hose.  If you breastfeed, you may not have a period for several months or longer. This is normal for the nursing mother. If you do not menstruate within 6 weeks after you stop breastfeeding, see your caregiver.  If you are not breastfeeding, you can expect to menstruate within 6 to 10 weeks after birth. If you have not started by the 11th week, check with your caregiver. SEEK MEDICAL CARE IF:    There is swelling, redness, or increasing pain in the wound area.  You have pus coming from the wound.  You notice a bad smell from the wound or surgical dressing.  You have pain, redness, and swelling from the intravenous (IV) site.  Your wound breaks open (the edges are not staying together).  You feel dizzy or feel like fainting.  You develop pain or bleeding when you urinate.  You develop diarrhea.  You develop nausea and vomiting.  You develop abnormal vaginal discharge.  You develop a rash.  You have any type of abnormal reaction or develop an allergy to your medicine.  Your pain is not relieved by your medicine or becomes worse.  Your temperature is 101 F (38.3 C), or is 100.4 F (38 C) taken 2 times in a 4 hour period. SEEK IMMEDIATE MEDICAL CARE:  You develop a temperature of 102 F (38.9 C) or higher.  You develop abdominal pain.  You develop chest pain.  You develop shortness of breath.  You faint.  You develop pain, swelling, or redness of your leg.  You develop heavy vaginal bleeding with or without blood clots. Document Released: 05/09/2002 Document Revised: 11/09/2011 Document Reviewed: 11/12/2010 [facility] Patient Information 2013 [place], [place]. Breastfeeding Deciding to breastfeed is one of the best choices you can make for you and your baby. The information that follows gives a brief overview of the benefits of breastfeeding as well as common topics surrounding breastfeeding. BENEFITS OF BREASTFEEDING For the baby  The first milk (colostrum) helps the baby's digestive system function better.   There are antibodies in the mother's milk that help the baby fight off infections.   The baby has a lower incidence of asthma, allergies, and sudden infant death syndrome (SIDS).   The nutrients in breast milk are better for the baby than infant formulas, and breast milk helps the baby's brain grow better.   Babies who breastfeed have less  gas, colic, and constipation.  For the mother  Breastfeeding helps develop a very special bond between the mother and her baby.   Breastfeeding is convenient, always available at the correct temperature, and costs nothing.   Breastfeeding burns calories in the mother and helps her lose weight that was gained during pregnancy.   Breastfeeding makes the uterus contract back down to normal size faster and slows bleeding following delivery.   Breastfeeding mothers have  a lower risk of developing breast cancer.  BREASTFEEDING FREQUENCY  A healthy, full-term baby may breastfeed as often as every hour or space his or her feedings to every 3 hours.   Watch your baby for signs of hunger. Nurse your baby if he or she shows signs of hunger. How often you nurse will vary from baby to baby.   Nurse as often as the baby requests, or when you feel the need to reduce the fullness of your breasts.   Awaken the baby if it has been 3 4 hours since the last feeding.   Frequent feeding will help the mother make more milk and will help prevent problems, such as sore nipples and engorgement of the breasts.  BABY'S POSITION AT THE BREAST  Whether lying down or sitting, be sure that the baby's tummy is facing your tummy.   Support the breast with 4 fingers underneath the breast and the thumb above. Make sure your fingers are well away from the nipple and baby's mouth.   Stroke the baby's lips gently with your finger or nipple.   When the baby's mouth is open wide enough, place all of your nipple and as much of the areola as possible into your baby's mouth.   Pull the baby in close so the tip of the nose and the baby's cheeks touch the breast during the feeding.  FEEDINGS AND SUCTION  The length of each feeding varies from baby to baby and from feeding to feeding.   The baby must suck about 2 3 minutes for your milk to get to him or her. This is called a "let down." For this reason,  allow the baby to feed on each breast as long as he or she wants. Your baby will end the feeding when he or she has received the right balance of nutrients.   To break the suction, put your finger into the corner of the baby's mouth and slide it between his or her gums before removing your breast from his or her mouth. This will help prevent sore nipples.  HOW TO TELL WHETHER YOUR BABY IS GETTING ENOUGH BREAST MILK. Wondering whether or not your baby is getting enough milk is a common concern among mothers. You can be assured that your baby is getting enough milk if:   Your baby is actively sucking and you hear swallowing.   Your baby seems relaxed and satisfied after a feeding.   Your baby nurses at least 8 12 times in a 24 hour time period. Nurse your baby until he or she unlatches or falls asleep at the first breast (at least 10 20 minutes), then offer the second side.   Your baby is wetting 5 6 disposable diapers (6 8 cloth diapers) in a 24 hour period by 76 53 days of age.   Your baby is having at least 3 4 stools every 24 hours for the first 6 weeks. The stool should be soft and yellow.   Your baby should gain 4 7 ounces per week after he or she is 75 days old.   Your breasts feel softer after nursing.  REDUCING BREAST ENGORGEMENT  In the first week after your baby is born, you may experience signs of breast engorgement. When breasts are engorged, they feel heavy, warm, full, and may be tender to the touch. You can reduce engorgement if you:   Nurse frequently, every 2 3 hours. Mothers who breastfeed early and often have fewer problems with engorgement.  Place light ice packs on your breasts for 10 20 minutes between feedings. This reduces swelling. Wrap the ice packs in a lightweight towel to protect your skin. Bags of frozen vegetables work well for this purpose.   Take a warm shower or apply warm, moist heat to your breast for 5 10 minutes just before each feeding. This  increases circulation and helps the milk flow.   Gently massage your breast before and during the feeding. Using your finger tips, massage from the chest wall towards your nipple in a circular motion.   Make sure that the baby empties at least one breast at every feeding before switching sides.   Use a breast pump to empty the breasts if your baby is sleepy or not nursing well. You may also want to pump if you are returning to work oryou feel you are getting engorged.   Avoid bottle feeds, pacifiers, or supplemental feedings of water or juice in place of breastfeeding. Breast milk is all the food your baby needs. It is not necessary for your baby to have water or formula. In fact, to help your breasts make more milk, it is best not to give your baby supplemental feedings during the early weeks.   Be sure the baby is latched on and positioned properly while breastfeeding.   Wear a supportive bra, avoiding underwire styles.   Eat a balanced diet with enough fluids.   Rest often, relax, and take your prenatal vitamins to prevent fatigue, stress, and anemia.  If you follow these suggestions, your engorgement should improve in 24 48 hours. If you are still experiencing difficulty, call your lactation consultant or caregiver.  CARING FOR YOURSELF Take care of your breasts  Bathe or shower daily.   Avoid using soap on your nipples.   Start feedings on your left breast at one feeding and on your right breast at the next feeding.   You will notice an increase in your milk supply 2 5 days after delivery. You may feel some discomfort from engorgement, which makes your breasts very firm and often tender. Engorgement "peaks" out within 24 48 hours. In the meantime, apply warm moist towels to your breasts for 5 10 minutes before feeding. Gentle massage and expression of some milk before feeding will soften your breasts, making it easier for your baby to latch on.   Wear a well-fitting  nursing bra, and air dry your nipples for a 3 after each feeding.   Only use cotton bra pads.   Only use pure lanolin on your nipples after nursing. You do not need to wash it off before feeding the baby again. Another option is to express a few drops of breast milk and gently massage it into your nipples.  Take care of yourself  Eat well-balanced meals and nutritious snacks.   Drinking milk, fruit juice, and water to satisfy your thirst (about 8 glasses a day).   Get plenty of rest.  Avoid foods that you notice affect the baby in a bad way.  SEEK MEDICAL CARE IF:   You have difficulty with breastfeeding and need help.   You have a hard, red, sore area on your breast that is accompanied by a fever.   Your baby is too sleepy to eat well or is having trouble sleeping.   Your baby is wetting less than 6 diapers a day, by 76 days of age.   Your baby's skin or white part of his or her eyes is  more yellow than it was in the hospital.   You feel depressed.  Document Released: 08/17/2005 Document Revised: 02/16/2012 Document Reviewed: 11/15/2011 [facility] Patient Information 2013 [place], [place]. Breastfeeding Challenges Breastfeeding can be challenging, especially in the first few weeks after childbirth. Challenges may discourage you from breastfeeding, but solutions are available to help you. Remember, it is natural for problems to arise when beginning to breastfeed. Below are some solutions to the most common breastfeeding challenges.  SORE OR PAINFUL NIPPLES Sore or painful nipples are a common complaint of breastfeeding mothers. Generally, your nipple sensitivity will go away around 1 week after delivery. Most often, nipple pain and soreness is due to an improper latch or poor positioning of the baby at the breast. If nipple pain continues for longer than 1 week, contact your caregiver or lactation specialist.  Solution Make sure the baby is positioned properly at  the breast.   Whether lying down or sitting, be sure that the baby's tummy is facing your tummy.   Support the breast with 4 fingers underneath the breast and the thumb above. Make sure your fingers are well away from the nipple and baby's mouth.   Stroke the baby's lips gently with your finger or nipple.   When the baby's mouth is open wide enough, place all of your nipple and as much of the areola as possible into the baby's mouth.   Pull the baby in close so the tip of the baby's nose and the cheeks touch the breast during the feeding.  Take care of your breasts by:   Bathing or showering daily.   Avoiding the use of soap on your nipples.   Gently massaging and expressing some milk before feeding. This will soften your breasts, making it easier for the baby to latch on. If only one nipple is sore, begin feeding on the least sore side until the let-down reflex occurs. This is when the milk glands release milk into the milk ducts. You may then switch the infant to the sore breast. Always pay attention to good positioning and latch-on.   Wearing a well-fitting nursing bra.   Only using cotton bra pads and changing them when they become soaked with milk.   Using pure lanolin on your nipples after nursing. You do not need to wash it off before nursing. Lanolin helps maintain the skin's normal moisture barrier and promotes healing. An alternative to lanolin is the mother's own breast milk. Express a few drops of milk after the baby has finished feeding and rub it gently into the skin, allowing it to air dry.  CRACKED NIPPLES Cracked nipples can happen due to thrush, dry skin, pumping incorrectly, poor positioning, or latching problems.  Solution Make sure the baby is positioned properly at the breast. You may use pure lanolin on your nipples after nursing to help heal the cracks. You can even rub a little breast milk on your nipples since the milk has healing properties. If the  nipples are so sore that the pressure of a nursing bra or clothing is painful, you may want to consider the use of breast shells along with the lanolin. PLUGGED DUCTS Plugged ducts can happen when a milk duct is not draining effectively and becomes inflamed. A tight-fitting nursing bra or latch problems may cause plugged ducts. Hard lumps, soreness, and redness may develop in your breast. If you develop a fever, you need to see your caregiver.  Solution Do not delay feedings. Feed your baby frequently and  try to empty your breasts of milk at each feeding. Try breastfeeding from the affected side first so there is a better chance that the milk will drain completely from that breast. Apply warm, moist towels to your breasts for 5 to 10 minutes before feeding. Gentle massage of the sore area before and during feeding may also help. Avoid wearing bras or clothes that are too tight and put pressure on your nipples. Wear bras that offer good support to your breasts.  BREAST ENGORGEMENT If you delay feedings because of a sleepy baby or the pain, your breasts may become overly full, or engorged. You also may have breast swelling, tenderness, warmth, redness, throbbing, and flattening of the nipple. Latching can then become difficult because the breasts are hard and full. Engorgement can be minimized by making sure the baby is well positioned and latched effectively on the breast during each feeding. Solution Feed your baby frequently and try to empty at least one of your breasts of milk at each feeding before switching to the other side. Take a warm shower or apply warm, moist towels to your breasts for 5 to 10 minutes before feeding. Gentle massage of the breast before and during feeding may also help. The more you breastfeed, the less likely your breasts will become engorged with milk. Avoid giving additional bottles or pacifiers as they cut down on the time you spend breastfeeding. Use an effective breast pump if  your baby is not nursing long enough or effectively enough to soften the breasts. If treated properly, engorgement should only last for 1 to 2 days.  MASTITIS  Mastitis is a bacterial infection of the breasts that can cause flu-like symptoms. Your breasts become very painful and you may develop a fever. The most common causes of mastitis are a poor latch, a baby who sucks ineffectively, consistent pressure on the breast which restricts the milk flow, unusual stress or fatigue, or missed feedings. Solution You will be given antibiotic medicine to treat the infection. It is still important to nurse frequently to empty your breasts, making sure your baby is positioned properly. Apply warm, moist towels to your breasts for a few minutes before feeding to help the milk flow and the breasts to empty more easily.  THRUSH  Ginette Pitman is a yeast infection that can form on your nipples, in your breast, or in your baby's mouth. It causes itching, soreness, burning or stabbing pain, and sometimes a rash.  Solution You will be given a medicated ointment for your nipples. Your baby will be given a liquid medicine for his or her mouth. It is important that both mother and baby be treated at the same time. Change disposable nursing pads often. Wash any bras, towels, or clothing that come in contact with the yeast in very hot water every day. Wash your hands often, and wash your baby's hands often, especially if he or she sucks on his or her fingers. Boil any pacifiers, bottle nipples, or toys your baby puts in his or her mouth once a day for 20 minutes. After 1 week of treatment, discard pacifiers and nipples and buy new ones. Boil all breast pump parts that touch the milk for 20 minutes every day.  LOW MILK SUPPLY If your baby is not gaining weight as he or she should, you may not be producing enough milk. Your milk supply depends on how frequently and effectively the baby empties the breast. Solution The more you  breastfeed and pump, the more  milk you will produce. It is important that the baby empties at least one breast at each feeding. If this is not happening, then use a breast pump to drain as much milk as you can from the breast after each feeding. If the baby is not emptying the breast, it may be due to latching or sucking problems. If the problem continues, contact a lactation specialist and be sure to keep all appointments with your caregiver as directed.  INVERTED OR FLAT NIPPLES Some women have nipples that turn inward instead of protruding outward, or nipples that are flat. Inverted or flat nipples can sometimes make it harder to breastfeed because your baby can have a harder time latching on.  Solution You may be given a small device that pulls out inverted nipples. This device should be applied right before the baby is brought to your breast. You can also try using your pump for a short time before placing the baby at your breast. The pump can pull your nipple outwards to help your infant latch more easily. The baby's sucking motion will help the inverted nipple protrude as well. Flat nipples cause fewer problems than inverted nipples. Good latch-on and positioning are usually enough to ensure that a baby latched to a flat nipple breastfeeds well. It is a good idea to encourage your baby to latch and feed frequently in the early days after birth. This will give the baby practice in latching correctly while the breast is still soft. Then, when the milk supply increases between the second and fourth day after birth and the breasts become full, the baby will have an easier time latching.  FOR MORE INFORMATION [employer]: www.llli.org Document Released: 02/08/2006 Document Revised: 02/16/2012 Document Reviewed: 11/15/2011 [facility] Patient Information 2013 [place], [place]. Postpartum Depression and Baby Blues The postpartum period begins right after the birth of a baby. During this time,  there is often a great amount of joy and excitement. It is also a time of considerable changes in the life of the parent(s). Regardless of how many times a mother gives birth, each child brings new challenges and dynamics to the family. It is not unusual to have feelings of excitement accompanied by confusing shifts in moods, emotions, and thoughts. All mothers are at risk of developing postpartum depression or the "baby blues." These mood changes can occur right after giving birth, or they may occur many months after giving birth. The baby blues or postpartum depression can be mild or severe. Additionally, postpartum depression can resolve rather quickly, or it can be a long-term condition. CAUSES Elevated hormones and their rapid decline are thought to be a main cause of postpartum depression and the baby blues. There are a number of hormones that radically change during and after pregnancy. Estrogen and progesterone usually decrease immediately after delivering your baby. The level of thyroid hormone and various cortisol steroids also rapidly drop. Other factors that play a major role in these changes include major life events and genetics.  RISK FACTORS If you have any of the following risks for the baby blues or postpartum depression, know what symptoms to watch out for during the postpartum period. Risk factors that may increase the likelihood of getting the baby blues or postpartum depression include:  Havinga personal or family history of depression.  Having depression while being pregnant.  Having premenstrual or oral contraceptive-associated mood issues.  Having exceptional life stress.  Having marital conflict.  Lacking a social support network.  Having a  baby with special needs.  Having health problems such as diabetes. SYMPTOMS Baby blues symptoms include:  Brief fluctuations in mood, such as going from extreme happiness to sadness.  Decreased concentration.  Difficulty  sleeping.  Crying spells, tearfulness.  Irritability.  Anxiety. Postpartum depression symptoms typically begin within the first month after giving birth. These symptoms include:  Difficulty sleeping or excessive sleepiness.  Marked weight loss.  Agitation.  Feelings of worthlessness.  Lack of interest in activity or food. Postpartum psychosis is a very concerning condition and can be dangerous. Fortunately, it is rare. Displaying any of the following symptoms is cause for immediate medical attention. Postpartum psychosis symptoms include:  Hallucinations and delusions.  Bizarre or disorganized behavior.  Confusion or disorientation. DIAGNOSIS  A diagnosis is made by an evaluation of your symptoms. There are no medical or lab tests that lead to a diagnosis, but there are various questionnaires that a caregiver may use to identify those with the baby blues, postpartum depression, or psychosis. Often times, a screening tool called the [place] Postnatal Depression Scale is used to diagnose depression in the postpartum period.  TREATMENT The baby blues usually goes away on its own in 1 to 2 weeks. Social support is often all that is needed. You should be encouraged to get adequate sleep and rest. Occasionally, you may be given medicines to help you sleep.  Postpartum depression requires treatment as it can last several months or longer if it is not treated. Treatment may include individual or group therapy, medicine, or both to address any social, physiological, and psychological factors that may play a role in the depression. Regular exercise, a healthy diet, rest, and social support may also be strongly recommended.  Postpartum psychosis is more serious and needs treatment right away. Hospitalization is often needed. HOME CARE INSTRUCTIONS  Get as much rest as you can. Nap when the baby sleeps.  Exercise regularly. Some women find yoga and walking to be beneficial.  Eat a balanced  and nourishing diet.  Do little things that you enjoy. Have a cup of tea, take a bubble bath, read your favorite magazine, or listen to your favorite music.  Avoid alcohol.  Ask for help with household chores, cooking, grocery shopping, or running errands as needed. Do not try to do everything.  Talk to people close to you about how you are feeling. Get support from your partner, family members, friends, or other new moms.  Try to stay positive in how you think. Think about the things you are grateful for.  Do not spend a lot of time alone.  Only take medicine as directed by your caregiver.  Keep all your postpartum appointments.  Let your caregiver know if you have any concerns. SEEK MEDICAL CARE IF: You are having a reaction or problems with your medicine. SEEK IMMEDIATE MEDICAL CARE IF:  You have suicidal feelings.  You feel you may harm the baby or someone else. Document Released: 05/21/2004 Document Revised: 11/09/2011 Document Reviewed: 06/23/2011 [facility] Patient Information 2013 [place], [place]. Newborn Baby Care BATHING YOUR BABY  Babies only need a bath 2 to 3 times a week. If you clean up spills and spit up and keep the diaper clean, your baby will not need a bath more often. Do not give your baby a tub bath until the umbilical cord is off and the belly button has normal looking skin. Use a sponge bath only.  Pick a time of the day when you can relax and enjoy  this special time with your baby. Avoid bathing just before or after feedings.  Wash your hands with warm water and soap. Get all of the needed equipment ready for the baby.  Equipment includes:  Basin of warm water (always check to be sure it is not too hot).  Mild soap and baby shampoo.  Soft washcloth and towel (may use cloth diaper).  Cotton balls.  Clean clothes and blankets.  Diapers.  Never leave your baby alone on a high suface where the baby can roll off.  Always keep 1 hand on your baby  when giving a bath. Never leave your baby alone in a bath.  To keep your baby warm, cover your baby with a cloth except where you are sponge bathing.  Start the bath by cleansing each eye with a separate corner of the cloth or separate cotton balls. Stroke from the inner corner of the eye to the outer corner, using clear water only. Do not use soap on your baby's face. Then, wash the rest of your baby's face.  It is not necessary to clean the ears or nose with cotton-tipped swabs. Just wash the outside folds of the ears and nose. If mucus collects in the nose that you can see, it may be removed by twisting a wet cotton ball and wiping the mucus away. Cotton-tipped swabs may injure the tender inside of the nose.  To wash the head, support the baby's neck and head with your hand. Wet the hair, then shampoo with a small amount of baby shampoo. Rinse thoroughly with warm water from a washcloth. If there is cradle cap, gently loosen the scales with a soft brush before rinsing.  Continue to wash the rest of the body. Gently clean in and around all the creases and folds. Remove the soap completely. This will help prevent dry skin.  For girls, clean between the folds of the labia using a cotton ball soaked with water. Stroke downward. Some babies have a bloody discharge from the vagina (birth canal). This is due to the sudden change of hormones following birth. There may be a white discharge also. Both are normal. For boys, follow circumcision care instructions. UMBILICAL CORD CARE The umbilical cord should fall off and heal by 2 to 3 weeks of life. Your newborn should receive only sponge baths until the umbilical cord has fallen off and healed. The umbilical cord and area around the stump do not need specific care, but should be kept clean and dry. If the umbilical stump becomes dirty, it can be cleaned with plain water and dried by placing cloth around the stump. Folding down the front part of the diaper can  help dry out the base of the cord. This may make it fall off faster. You may notice a foul odor before it falls off. When the cord comes off and the skin has sealed over the navel, the baby can be placed in a bathtub. Call your caregiver if your baby has:  Redness around the umbilical area.  Swelling around the umbilical area.  Discharge from the umbilical stump.  Pain when you touch the belly. CIRCUMCISION CARE  If your baby boy was circumcised:  There may be a strip of petroleum jelly gauze wrapped around the penis. If so, remove this after 24 hours or sooner if soiled with stool.  Wash the penis gently with warm water and a soft cloth or cotton ball and dry it. You may apply petroleum jelly to his penis  with each diaper change, until the area is well healed. Healing usually takes 2 to 3 days.  If a plastic ring circumcision was done, gently wash and dry the penis. Apply petroleum jelly several times a day or as directed by your baby's caregiver until healed. The plastic ring at the end of the penis will loosen around the edges and drop off within 5 to 8 days after the circumcision was done. Do not pull the ring off.  If the plastic ring has not dropped off after 8 days or if the penis becomes very swollen and has drainage or bright red bleeding, call your caregiver.  If your baby was not circumcised, do not pull back the foreskin. This will cause pain, as it is not ready to be pulled back. The inside of the foreskin does not need cleaning. Just clean the outer skin. COLOR  A small amount of bluishness of the hands and feet is normal for a newborn. Bluish or grayish color of the baby's face or body is not normal. Call for medical help.  Newborns can have many normal birthmarks on their bodies. Ask your baby's nurse or caregiver about any you find.  When crying, the newborn's skin color often becomes deep red. This is normal.  Jaundice is a yellowish color of the skin or in the white  part of the baby's eyes. If your baby is becoming jaundiced, call your baby's caregiver. BOWEL MOVEMENTS The baby's first bowel movements are sticky, greenish black stools called meconium. The first bowel movement normally occurs within the first 36 hours of life. The stool changes to a mustard-yellow loose stool if the baby is breastfed or a thicker yellow-tan stool if the baby is fed formula. Your baby may make stool after each feeding or 4 to 5 times per day in the first weeks after birth. Each baby is different. After the first month, stools of breastfed babies become less frequent, even fewer than 1 a day. Formula-fed babies tend to have at least 1 stool per day.  Diarrhea is defined as many watery stools in a day. If the baby has diarrhea you may see a water ring surrounding the stool on the diaper. Constipation is defined as hard stools that seem to be painful for the baby to pass. However, most newborns grunt and strain when passing any stool. This is normal. GENERAL CARE TIPS   Babies should be placed to sleep on their backs unless your caregiver has suggested otherwise. This is the single most important thing you can do to reduce the risk of sudden infant death syndrome.  Do not use a pillow when putting the baby to sleep.  Fingers and toenails should be cut while the baby is sleeping, if possible, and only after you can see a distinct separation between the nail and the skin under it.  It is not necessary to take the baby's temperature daily. Take it only when you think the skin seems warmer than usual or if the baby seems sick. (Take it before calling your caregiver.) Lubricate the thermometer with petroleum jelly and insert the bulb end approximately  inch into the rectum. Stay with the baby and hold the thermometer in place 2 to 3 minutes by squeezing the cheeks together.  The disposable bulb syringe used on your baby will be sent home with you. Use it to remove mucus from the nose if  your baby gets congested. Squeeze the bulb end together, insert the tip very gently into  one nostril, and let the bulb expand. It will suck mucus out of the nostril. Empty the bulb by squeezing out the mucus into a sink. Repeat on the second side. Wash the bulb syringe well with soap and water, and rinse thoroughly after each use.  Do not over dress the baby. Dress him or her according to the weather. One extra layer more than what you are wearing is a good guideline. If the skin feels warm and damp from perspiring, your baby is too warm and will be restless.  It is not recommended that you take your infant out in crowded public areas (such as shopping malls) until the baby is several weeks old. In crowds of people, the baby will be exposed to colds, virus, and diseases. Avoid children and adults who are obviously sick. It is good to take the infant out into the fresh air.  It is not recommended that you take your baby on long-distance trips before your baby is 3 to 67 months old, unless it is necessary.  Microwaves should not be used for heating formula. The bottle remains cool, but the formula may become very hot. Reheating breast milk in a microwave reduces or eliminates natural immunity properties of the milk. Many infants will tolerate frozen breast milk that has been thawed to room temperature without additional warming. If necessary, it is more desirable to warm the thawed milk in a bottle placed in a pan of warm water. Be sure to check the temperature of the milk before feeding.  Wash your hands with hot water and soap after changing the baby's diaper and using the restroom.  Keep all your baby's doctor appointments and scheduled immunizations. SEEK MEDICAL CARE IF:  The cord stump does not fall off by the time the baby is 78 weeks old. SEEK IMMEDIATE MEDICAL CARE IF:   Your baby is 32 months old or younger with a rectal temperature of 100.4 F (38 C) or higher.  Your baby is older than 3  months with a rectal temperature of 102 F (38.9 C) or higher.  The baby seems to have little energy or is less active and alert when awake than usual.  The baby is not eating.  The baby is crying more than usual or the cry has a different tone or sound to it.  The baby has vomited more than once (most babies will spit up with burping, which is normal).  The baby appears to be ill.  The baby has diaper rash that does not clear up in 3 days after treatment, has sores, pus, or bleeding.  There is active bleeding at the umbilical cord site. A small amount of spotting is normal.  There has been no bowel movement in 4 days.  There is persistent diarrhea or blood in the stool.  The baby has bluish or gray looking skin.  There is yellow color to the baby's eyes or skin. Document Released: 08/14/2000 Document Revised: 11/09/2011 Document Reviewed: 03/05/2008 [facility] Patient Information 2013 [place], [place]. Caring for Common Problems of Your Infant at Home Call your clinic to make a follow-up visit for your infant as told by your caregiver. You should make an appointment for your baby to be seen at 61 weeks of age for a well baby visit, unless your caregiver wants to see you sooner. For appointments, bring:  A diaper and a change of clothes.  A bottle of formula if your baby is bottle-fed, or a bottle of water if your  baby is breastfed. If you have questions, please write them down. Bring your list with you when the baby is examined. If something seems unusual or you are worried about a problem, call your caregiver. COMMON QUESTIONS What are the white spots on my baby's face? Both neonatal acne and milia are common in the newborns. Both conditions are normal for newborns, and both usually resolve on their own in 6 to 8 weeks.  What should I do for diaper rash? If there is diaper rash for more than 3 days, you can treat it with an over-the-counter cream, powder, or ointment for a fungal  infection. If there is no improvement within 3 days, call your caregiver or make an appointment for your baby to be seen. How much pain medication should I give my baby? Do not give any medication to your baby until at least 72 weeks of age and then only after checking with your caregiver. What is fever? Fever in a newborn or infant younger than 2 months can be very serious. Call your doctor if:   Your baby is 14 months old or younger with a rectal temperature of 100.4 F (38 C) or higher.  Your baby is older than 3 months with a rectal temperature of 102 F (38.9 C) or higher.  You think your baby has a fever and you are not able to measure it. What are the white spots in my baby's mouth? It is common to see thrush in newborns. This condition is causing the white spots. This condition is not serious. The white spots are a very mild fungal infection that can be easily wiped off and treated with over-the-counter or prescription medications.  SAFETY Accidents are the leading and most preventable cause of death for children. Consider these safety tips:  Your child should ride in a rear-facing car seat until your doctor tells you that your child can face forward. Be sure to have your seat checked to see if it is appropriately installed. Never allow your infant to ride in the front seat.  There should only be 2 3/8 inches (5.08 centimeters) between slats in your child's crib. An older crib may have spaces that are too big. The mattress should fit snugly so that your child will not get trapped between the crib and mattress. Do not place blankets or large stuffed animals in the crib that could smother your baby.  Always place your baby on his or her back to decrease risk of sudden infant death syndrome (SIDS). Vomiting In Infants and Newborns Forceful vomiting in newborns and young infants is not normal and may be serious, especially if associated with:  Fever (temperature greater than 100.73F  [38C]).  Weight loss.  Irritability, decreased activity, or crying for a prolonged period.  Not eating.  Vomit that looks green or yellow (bilious) or is forceful.  Hunger after vomiting.  Signs of abdominal pain. If your baby is vomiting and has any of the above symptoms, call your caregiver. Most of the time vomiting is not serious and may just represent gastroesophageal reflux disease (GERD). Gastroesophageal reflux disease is normal in newborn and infants, and your child may be what caregivers call "a happy spitter". This is when your baby painlessly and effortlessly spits up their milk but appears perfectly happy. This will gradually improve as your baby gets older. Most infants with GERD will gain weight appropriately and not develop any problems. If you are concerned about GERD affecting your baby, you can discuss the  following lifestyle changes with your caregiver:  Changing formula.  Changing how you position your baby when you place them down.  Breastfeeding.  Thickening your baby's feeds. Less commonly, vomiting in babies can be caused by an allergy to a protein in their formula called milk protein allergy. Most often newborns and infants with milk protein allergy are irritable and have bloody diarrhea, but they can also have vomiting.  Another condition called pyloric stenosis occurs in about 3 of every 1,000 births. In this condition infants have forceful or projectile vomit. Parents often describe this as vomit "shooting" across the room. Shortly after vomiting, infants appear to be very hungry. If your baby's belly appears swollen or you see what looks like a green or yellow fluid in the vomit, your baby could have twisted and blocked bowels. This requires prompt evaluation by your caregiver. Vomiting In Older Infants Vomiting and diarrhea in infants may occur with infections. The most common cause of vomiting in older infants is gastroenteritis, usually caused by a viral  infection. However, a sore throat, ear infection, or bladder infection can also cause vomiting.  If your infant is between 6 months and 42 months old and suddenly gets crampy, abdominal pain and vomiting that progressively worsens, call your caregiver. Older infants are at risk for a type of intestinal obstruction (intussusception). Also, if vomiting is associated with a headache, you should discuss this with your caregiver. If vomiting or diarrhea occur in large amounts, and the baby is not taking enough fluids, the baby may lose too much body water and body salts and become dehydrated (loss of body fluids). Suspect dehydration if:   The eyes look sunken in.  The tongue and mouth are dry.  Diaper wetting decreases. Babies younger than 3 months require special care and close watching because they lose body water and become dehydrated a lot faster. True vomiting is when food is brought up from the stomach. This is different from when babies "spit up" small amounts. The most common cause of diarrhea in infants is intolerance to the protein in the formula. It is most important to prevent dehydration in infants. Dehydration can come on quicker when there is both vomiting and diarrhea.  Diarrhea In Newborns And Older Infants Many parents often confuse diarrhea with normal baby stools. Normal baby stools are soft and loose. Your baby may have a stool after each feeding during the first 2 months of life, especially breastfed babies. As a result, determining what is diarrhea and what is normal baby stool can sometimes be hard for parents. Diarrhea is watery stools, not just one or two loose stools during the day but several.  You should be concerned about changes such as stools that are more frequent or watery. Realize that babies stools may change as a result of the use of antibiotics or a change in diet. Additionally, if you are breastfeeding, similar changes by you, such as changes in your diet, could affect  your babies stools. As infants get older, diarrhea can be caused the infections. The most common infection is caused by rotavirus for which there is now a vaccination. In young infants, the main concern is dehydration. If your baby is 27 months old or younger and you suspect he or she has has diarrhea, call your caregiver. Call your caregiver anytime you see pus or blood in your baby's stool or if your baby has fever and diarrhea last more than 3 days. What To Do Infants Breastfed babies have stools  that are more loose than formula-fed babies. If your baby is breastfed and develops diarrhea, continue to breastfeed, unless your doctor tells you to stop, and monitor their urine output closely. If your baby urinates less often than normal or you have to change fewer diapers, call your caregiver. Breast milk is more easily digested than any other fluid and can be used for mild dehydration. You can breastfeed for 5 minutes every 30 minutes. If your baby does not vomit for 2 hours go back to your regular schedule. Guidelines for replacing fluid: Replace any fluid lost through diarrhea or vomiting with  to  cup or 2 to 4 oz (60 to 120 ml) of oral rehydration solution (ORS) for each diarrheal stool or vomiting episode. If there is no vomiting for 2 hours go back to your normal feeding schedule. Older infants Older infants can continue to eat if they want to, as long as you monitor them for signs of dehydration. Encourage older infants to continue to drink fluids even if they do not want to eat but avoid giving them large amounts of any drinks with a high sugar content, including juices and soda. The best fluids are commercially available ORSs. Guidelines for replacing fluid: Replace any fluid lost through diarrhea or vomiting with ORS as follows:   If your baby weighs 22 lb or less (10 kg or less), give him or her  to  cup or 2 to 4 oz (60 to 120 ml) of ORS for each diarrheal stool or vomiting episode.  If  your baby weighs more than more than 22 lb (10 kg), give him or her  to 1 cup or 4 to 8 oz (120 to 240 ml) of ORS for each diarrheal stool or vomiting episode. If your baby continues to vomit even these small amounts or continues to have diarrhea in spite of treatment, contact your caregiver. Colic All babies experience fussiness. Fussiness that occurs for an extended period or becomes uncontrollable is referred to as colic. Babies with colic will differ in the:  Amount of symptoms.  Duration of colic. The cause of colic is not known. Colic can occur in either breastfed or bottle-fed babies. It is usually worse in the late afternoon or evening. Colic often happens between 3 weeks and 3 months of life and usually goes away after that time. If your baby is 59 weeks old or younger and has colic symptoms talk with your caregiver. The cause of colic is unknown, but there may be several factors involved. A baby who swallows a lot of air and does not burp easily may develop colicky symptoms. Colic also may be secondary to rapid feeding or over feeding. Sometimes a change your baby's diet, including formula, will cause him or her to have colic. Colic is not a serious medical condition. Your baby will eat, grow, and gain weight with no long term affects. Symptoms  Your baby may have facial redness (flushing), arch his or her back, pull his or her arms and legs up to the belly, have a tense belly (abdomen), cry loudly with fists clenched, and generally seem irritable and fussy.  Usually there is no weight loss, fever, diarrhea, or vomiting.  Symptoms may last as long as 3 hours per day on more than 3 days of the week. Symptoms often occur at the same time of day.  Symptoms improve as he or she tires himself or herself out.  Symptoms generally begin to improve after 6 weeks.  If  your baby is older than 3 months and symptoms continue, talk with your caregiver about other possible diagnoses.  Happy  spitters do not benefit from medications for GERD. What Can Be Done About Colic?   Be sure your baby has burped after each feeding.  Provide a quiet, calm place for your baby. Avoid stress and tension. Many parents find holding their baby is an effective way to comfort him or her. Gentle rocking or swinging are also effective. Singing lullabies or playing music can soothe your baby. Pacifiers allow babies to suck, which can be comforting. Place your baby on his or her tummy and rub his or her back, but do not let him or her sleep on their belly.  Your caregiver might recommend changing formulas. Your caregiver may want you to change from an iron-fortified formula to a plain or soy bean-based formula.  Colic can be very frustrating and cause extra stress on the parents. It is important to get help and support from family and friends. It is important to find counseling if necessary. Be observant. Do you notice symptoms after feeding or certain medications? Avoid overfeeding or feeding too quickly.  If the condition persists or if the child vomits, has a fever, diarrhea, or bloody stools, call your caregiver. Medication might sometimes be ordered in cases of severe colic. Diaper Rash Diaper rash is a common condition in infants. Do not become alarmed if your infant has a mild rash. You can initially treat it with over-the-counter products for rashes that are present for 3 days. If there is not any improvement after 3 days of treatment, discuss other treatment options with your caregiver. Causes  Too much moisture.  Urine and stool are left touching the skin for a long time.  Infection.  Allergy to the diaper.  Diarrhea.  Babies begin eating solid food.  Antibiotic use or nursing mothers taking antibiotics. What Can Be Done About Diaper Rash?  Change your baby's diapers more often.  Wash your baby's buttocks with warm water and mild soap after each bowel movement. Dry the skin well and  be sure all of the soap is removed. Wipe your baby with water and a clean cloth after each urination.  Expose your baby's buttocks to air for 10 minutes many times a day or leave the diaper off during your baby's nap.  Avoid the use of rubber or plastic pants. These trap in moisture and can cause irritation. Sometimes the elastic band at the top of the pants causes irritation and a rash.  Consider changing the type of diaper you use.  Sometimes a baby's skin will react to various types of commercial baby wipes. Avoid using wipes that can dry the skin. Consider using a clean cloth with water or a paper towel for a time to see if this helps clear up the rash.  If your baby's skin is irritated, use a barrier paste-like zinc oxide or petroleum jelly on your baby's buttocks after washing it. Other ointments may also be used. However, do not use creams with steroids without your caregiver's permission.  Use 2 regular diapers or extra-absorbent disposable diapers at night and nap times. If you have tried all of these suggestions and have not been successful or if your baby's rash is severe ,with sores, pus, fever, or bleeding, see your caregiver. Your baby could have an infection causing the rash. The rash should clear up with proper medication. Constipation Causes  The most common constipation in infants is functional  constipation. This means there is no medical problem. In babies not yet eating solid foods, it is most often caused by a lack of fluid.  Older infants on solid foods can get constipated due to:  A lack of fluid.  A lack of bulk (fiber).  Some babies have brief constipation when switching from breast milk to formula or from formula to cow's milk.  Constipation can be a side effect of medicine, but this is uncommon in infants.  Constipation that starts at or right after birth can sometimes be a sign of problems, such as problems with the intestine or the anus. Possible  Solution:  Use pediatric glycerine suppositories as directed by your caregiver. You insert these gently into your infant's rectum, and often they will cause your baby to expel stool with the suppository shortly thereafter. Do not become alarmed if your baby's stooling pattern changes as long, as he or she seems to be content. None of these remedies need to be done unless your child has gone 4 or 5 days without having a bowel movement and seems to be experiencing some abdominal pain as a result of this. Normal stool for bottle-fed and breastfed babies often will be:  Mustard color or sometimes green.  A stain on diapers to loose, unformed, pea soup consistency.  Yellowish green to brownish in color.  Mild in odor or not unpleasant. Green and watery stools are not always a concern in an otherwise healthy baby. SEEK IMMEDIATE MEDICAL CARE IF:  Your baby has the following symptoms and is younger than 59 months old.  If diarrhea and vomiting are accompanied by other signs of infection. These signs include:  Pulling ears.  Sore throat.  Crying when wetting.  Your baby is 39 months old or younger, with a rectal temperature of 100.4 F (38 C) or higher.  Your baby is older than 3 months, with a rectal temperature of 102 F (38.9 C) or higher.  Blood or pus in the stool.  Vomit is forceful or projectile.  Your baby develops signs of dehydration with fever:  Sunken eyes.  Dry mouth.  Weight loss.  Irritability.  Drowsiness (lethargic).  Decrease in urination. If your baby does not urinate in an 8-hour to 12-hour period, contact your physician.  Your baby has diaper rash that will not clear up after 3 days of treatment or has sores, pus, or bleeding.  Your baby will not take fluids as recommended, if the vomiting or diarrhea is persistent, or if the baby seems ill.  Your baby has not had a bowel movement in 4 to 5 days.  You need help with your baby because you cannot  control your baby's crying because of colic. Document Released: 08/14/2000 Document Revised: 11/09/2011 Document Reviewed: 06/11/2009 [facility] Patient Information 2013 [place], [place].  Breast Pumping Tips Pumping breast milk is a good way produce more milk and a steady supply for your infant. In general, the more you breastfeed or pump, the more milk you will produce. Talk to your doctor or breastfeeding specialist if you need more information or support. HOME CARE  Drink enough fluids to keep your pee (urine) clear or pale yellow.  Eat a healthy diet.  Exercise as told by your doctor.  Rest often. Sleep when your infant sleeps.  Do not smoke.  Ask your doctor about birth control options. Pumping breastmilk:  Relax and find a quiet place to pump. Breast massage, soothing heat on your breasts, music, pictures, or tape recordings  of your infant may help you relax.  Place the suction cup of the pump right over the nipple.  Some discomfort is normal at first. If pumping continues to be painful, you may need a different pump. Talk to your breastfeeding specialist.  Put lanolin ointment on sore nipples and the areola.  Pump after each feeding session. This will boost your milk supply.  If you are away from your infant for several hours, pump for 15 minutes every 2 to 3 hours. Pump both breasts.  If your infant has a formula feeding, pump around the same time.  Pump a few weeks before you go back to work. This will help you find a routine that works for you. GET HELP RIGHT AWAY IF:   You are having trouble pumping or feeding your infant.  You think you are not making enough milk.  You have nipple pain, soreness, or redness.  You have other questions or concerns. MAKE SURE YOU:   Understand these instructions.  Will watch your condition.  Will get help right away if you are not doing well or get worse. Document Released: 02/03/2008 Document Revised: 11/09/2011 Document  Reviewed: 02/03/2008 [facility] Patient Information 2013 [place], [place].

## 2012-12-21 NOTE — Lactation Note (Signed)
This note was copied from the chart of Katherine Cooley. Lactation Consultation Note Mom states br feeding is going much better; states she is using the comfort gels which helped her nipples feel better; states that she wanted to give formula last night b/c her nipples were hurting. Mom states that today her nipples feel better and her baby is latching deeper which helps.  Discussed prevention and treatment of engorgement and sore nipples. Questions answered. Enc mom to call lactation office if she has any concerns, and to attend the [facility].  Patient Name: Katherine Octavie Westerhold [id number] Date: 12/21/2012 Reason for consult: Follow-up assessment   Maternal Data Has patient been taught Hand Expression?: Yes  Feeding Feeding Type: Formula Feeding method: Bottle Length of feed: 60 min  LATCH Score/Interventions Latch: Grasps breast easily, tongue down, lips flanged, rhythmical sucking.  Audible Swallowing: Spontaneous and intermittent  Type of Nipple: Everted at rest and after stimulation  Comfort (Breast/Nipple): Soft / non-tender     Hold (Positioning): No assistance needed to correctly position infant at breast.  LATCH Score: 10  Lactation Tools Discussed/Used     Consult Status Consult Status: PRN    Lenard Forth 12/21/2012, 9:54 AM

## 2013-11-06 IMAGING — US US OB TRANSVAGINAL
1 series · 13 of 28 positions shown · non-contrast
Comparison: none

[Series 1: us ob comp less 14 wks · 13 of 57 slices shown]
[im 3/57]
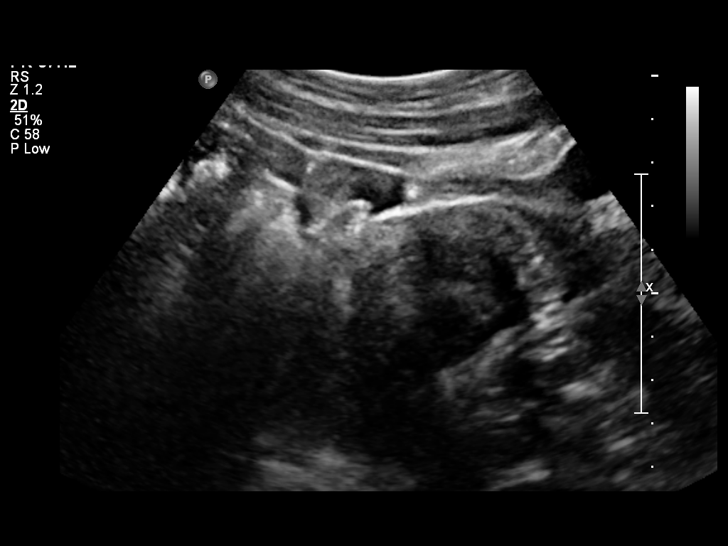
[im 7/57]
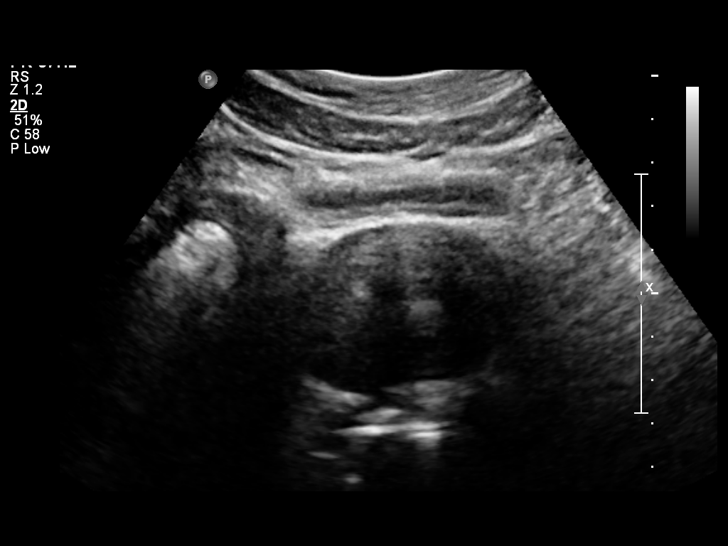
[im 11/57]
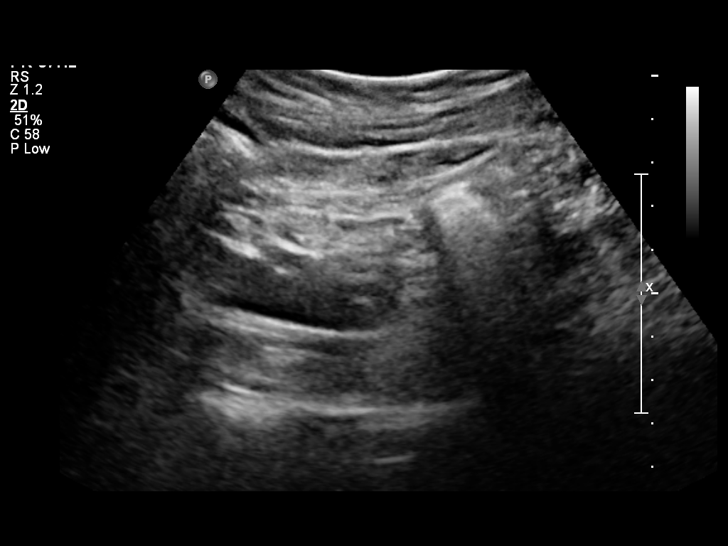
[im 15/57]
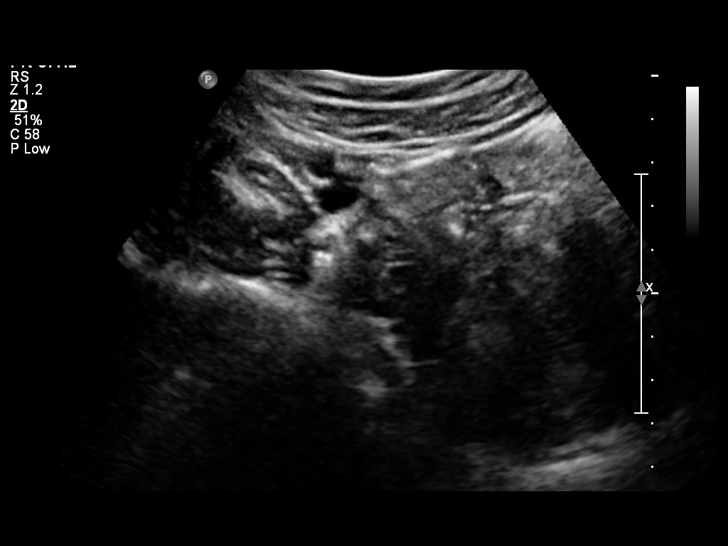
[im 19/57]
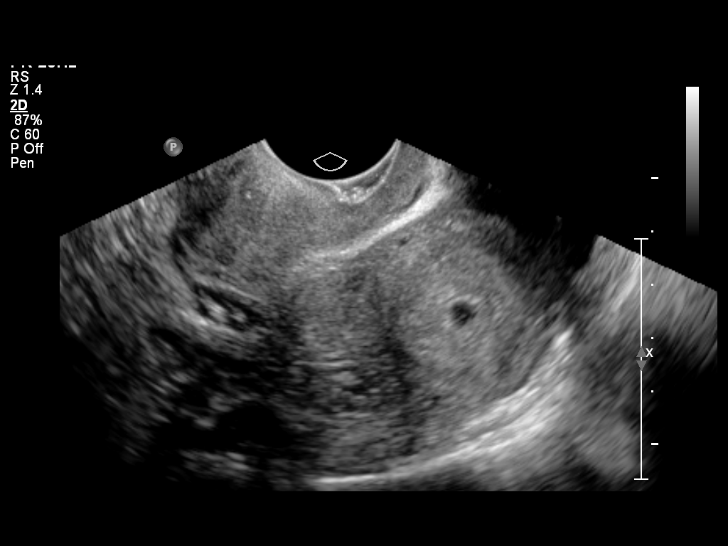
[im 23/57]
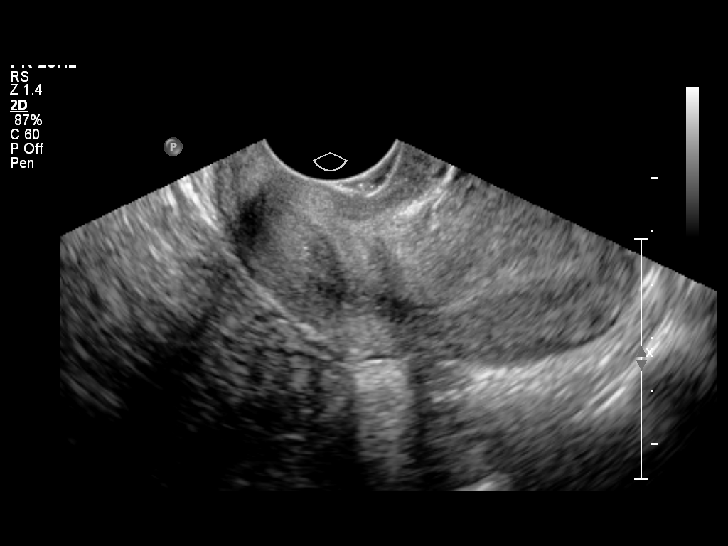
[im 30/57]
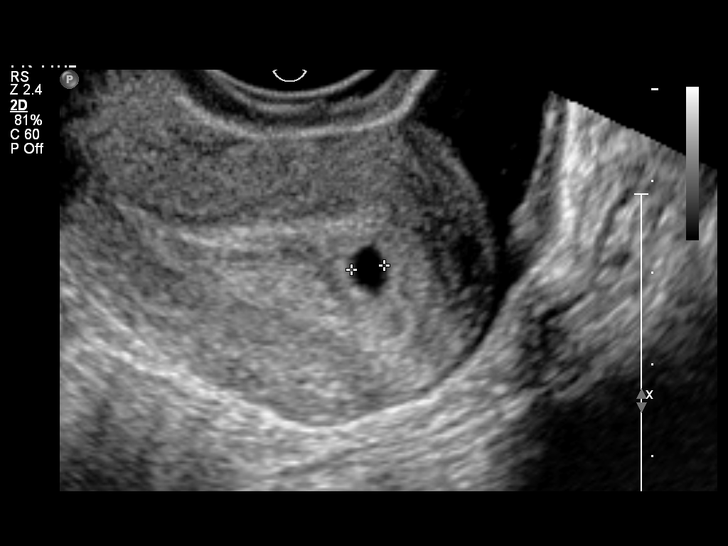
[im 34/57]
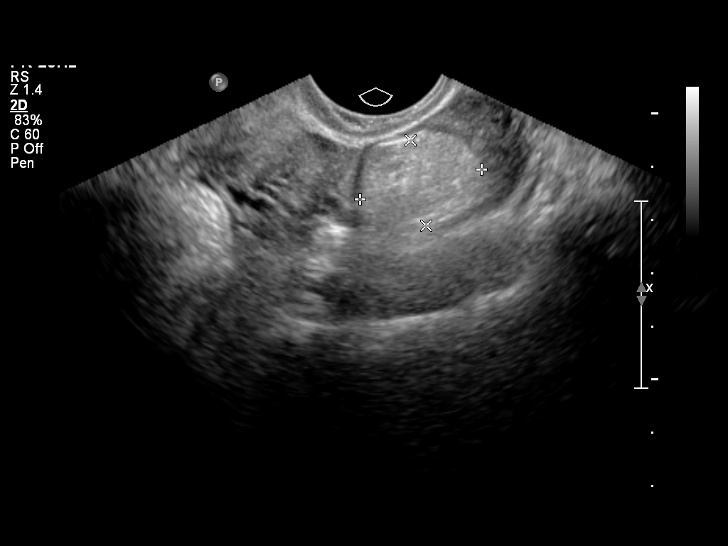
[im 38/57]
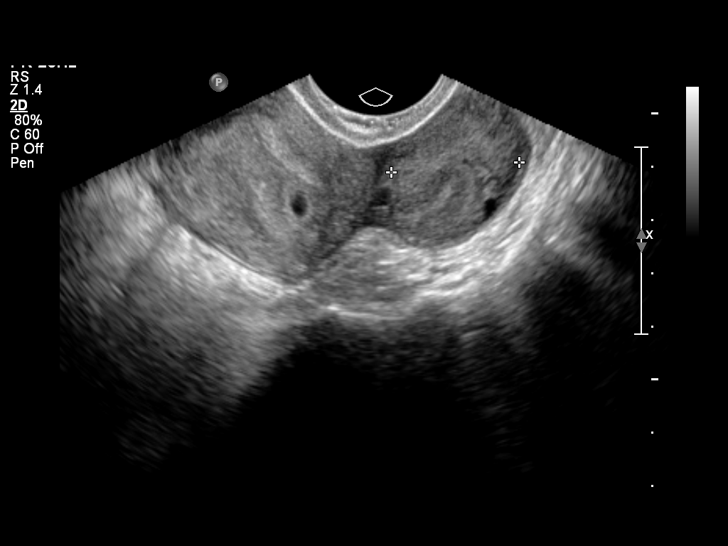
[im 42/57]
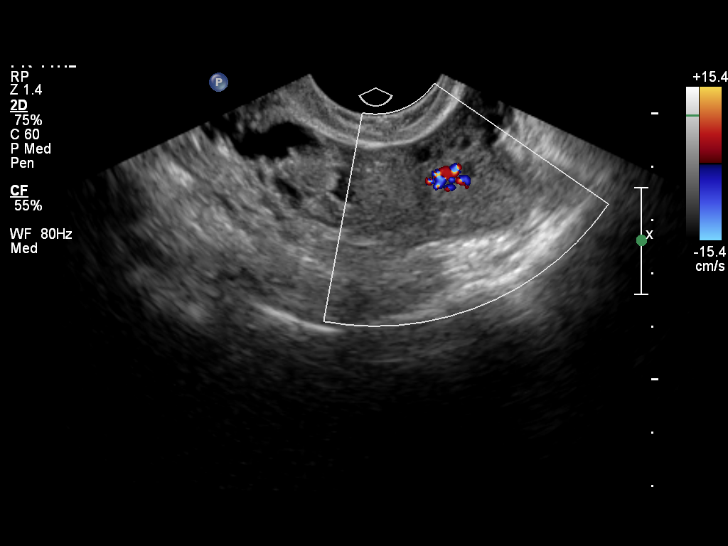
[im 46/57]
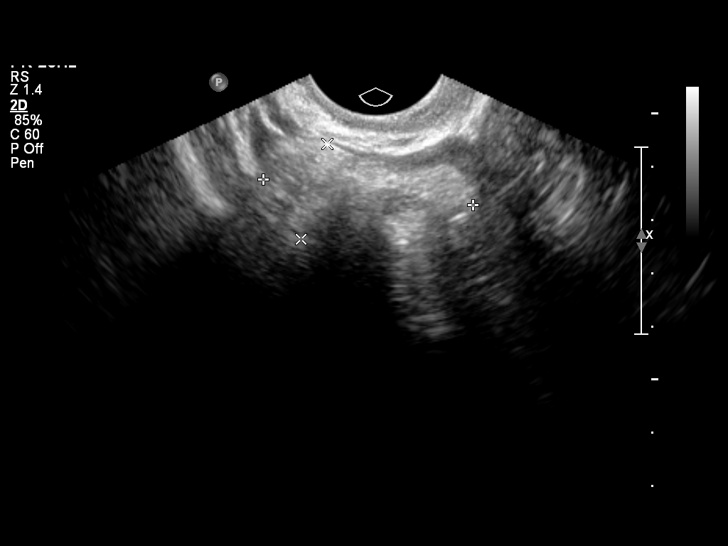
[im 50/57]
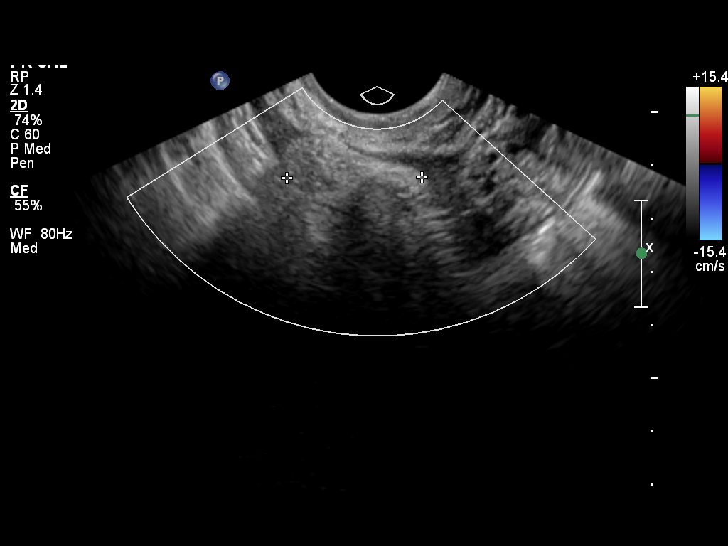
[im 54/57]
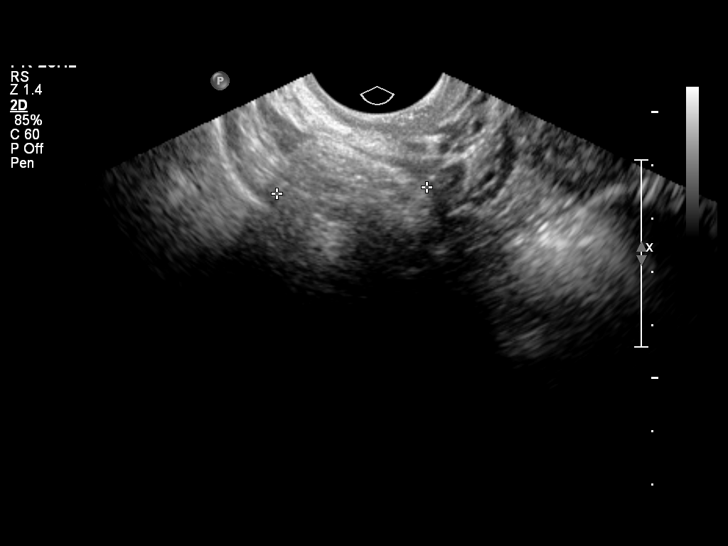

[13 of 28 positions shown; findings below may reference images not displayed]

OBSTETRICS REPORT
                      (Signed Final 04/25/2012 [DATE])

                 37_E
Procedures

 US OB COMP LESS 14 WKS                                76801.0
 US OB TRANSVAGINAL                                    76817.0
Indications

 Pain - Abdominal/Pelvic
 Back pain
Fetal Evaluation

 Gest. Sac:         Intrauterine
 Yolk Sac:          Not visualized
 Fetal Pole:        Not visualized
 Cardiac Activity:  No embryo visualized
Biometry

 GS:       5.2  mm    G. Age:   5w 0d                  EDD:   12/26/12
Gestational Age

 LMP:           4w 4d        Date:   03/24/12                 EDD:   12/29/12
 Best:          4w 4d     Det. By:   LMP  (03/24/12)          EDD:   12/29/12
Cervix Uterus Adnexa

 Uterus:       No abnormality visualized.
 Cul De Sac:   Moderate amount of free fluid seen.
 Left Ovary:   Size(cm) L: 3.53 x W: 2.41 x H: 1.96  Volume(cc):
               8.7.  echogenic mass within ovary = 2.6x9.1x2.2cm
 Right Ovary:  Size(cm) L: 4 x W: 3.48 x H: 2.23  Volume(cc): 16.3.
               Echogenic mass within ovary = 3.0x6.0x1.0 cm
Impression

 Intrauterine gestational sac visualized. No yolk sac or embryo
 is visible at this time, but not unexpected given the small GS
 size. Imaging findings correlate with dating by LMP.
 Bilateral ovarian dermooids, measure 2.4 cm greatest
 diameter in the left ovary, and 3.9 cm greatest diameter in the
 right ovary.
Recommendations

 Ultrasound at 18-19 weeks GA for fetal anatomic evaluation,
 and earlier as needed.

 questions or concerns.

## 2013-12-11 ENCOUNTER — Encounter (HOSPITAL_COMMUNITY): Payer: Self-pay | Admitting: *Deleted

## 2013-12-11 ENCOUNTER — Inpatient Hospital Stay (HOSPITAL_COMMUNITY)
Admission: AD | Admit: 2013-12-11 | Discharge: 2013-12-11 | Disposition: A | Discharge status: Home / Self Care | Payer: Self-pay | Source: Ambulatory Visit | Attending: Obstetrics & Gynecology | Admitting: Obstetrics & Gynecology

## 2013-12-11 DIAGNOSIS — M545 Low back pain, unspecified: Secondary | ICD-10-CM | POA: Insufficient documentation

## 2013-12-11 DIAGNOSIS — A499 Bacterial infection, unspecified: Secondary | ICD-10-CM | POA: Insufficient documentation

## 2013-12-11 DIAGNOSIS — B9689 Other specified bacterial agents as the cause of diseases classified elsewhere: Secondary | ICD-10-CM | POA: Insufficient documentation

## 2013-12-11 DIAGNOSIS — M549 Dorsalgia, unspecified: Secondary | ICD-10-CM

## 2013-12-11 DIAGNOSIS — N760 Acute vaginitis: Secondary | ICD-10-CM | POA: Insufficient documentation

## 2013-12-11 LAB — CBC
HEMATOCRIT: 39.3 % (ref 36.0–46.0)
HEMOGLOBIN: 13.4 g/dL (ref 12.0–15.0)
MCH: 30.8 pg (ref 26.0–34.0)
MCHC: 34.1 g/dL (ref 30.0–36.0)
MCV: 90.3 fL (ref 78.0–100.0)
Platelets: 247 10*3/uL (ref 150–400)
RBC: 4.35 MIL/uL (ref 3.87–5.11)
RDW: 13.1 % (ref 11.5–15.5)
WBC: 7.9 10*3/uL (ref 4.0–10.5)

## 2013-12-11 LAB — URINALYSIS, ROUTINE W REFLEX MICROSCOPIC
BILIRUBIN URINE: NEGATIVE
GLUCOSE, UA: NEGATIVE mg/dL
Ketones, ur: NEGATIVE mg/dL
Leukocytes, UA: NEGATIVE
NITRITE: NEGATIVE
PH: 6.5 (ref 5.0–8.0)
Protein, ur: NEGATIVE mg/dL
SPECIFIC GRAVITY, URINE: 1.02 (ref 1.005–1.030)
Urobilinogen, UA: 0.2 mg/dL (ref 0.0–1.0)

## 2013-12-11 LAB — WET PREP, GENITAL
Trich, Wet Prep: NONE SEEN
YEAST WET PREP: NONE SEEN

## 2013-12-11 LAB — URINE MICROSCOPIC-ADD ON

## 2013-12-11 LAB — POCT PREGNANCY, URINE: Preg Test, Ur: NEGATIVE

## 2013-12-11 MED ORDER — IBUPROFEN 600 MG PO TABS: 600.0000 mg | ORAL_TABLET | Freq: Four times a day (QID) | ORAL | Status: DC | PRN

## 2013-12-11 MED ORDER — METRONIDAZOLE 500 MG PO TABS: 500.0000 mg | ORAL_TABLET | Freq: Two times a day (BID) | ORAL | Status: DC

## 2013-12-11 NOTE — MAU Provider Note (Signed)
Attestation of Attending Supervision of Advanced Practitioner (CNM/NP): Evaluation and management procedures were performed by the Advanced Practitioner under my supervision and collaboration.  I have reviewed the Advanced Practitioner's note and chart, and I agree with the management and plan.  Boyd Litaker Harraway-Oberle 7:46 PM     
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
## 2013-12-11 NOTE — MAU Note (Signed)
Patient states she has been having low back pain for 2 weeks. Getting worse. Has abdominal pressure after urination. Denies bleeding, discharge, nausea or vomiting.

## 2013-12-11 NOTE — Discharge Instructions (Signed)
Bacterial Vaginosis Bacterial vaginosis is a vaginal infection that occurs when the normal balance of bacteria in the vagina is disrupted. It results from an overgrowth of certain bacteria. This is the most common vaginal infection in women of childbearing age. Treatment is important to prevent complications, especially in pregnant women, as it can cause a premature delivery. CAUSES  Bacterial vaginosis is caused by an increase in harmful bacteria that are normally present in smaller amounts in the vagina. Several different kinds of bacteria can cause bacterial vaginosis. However, the reason that the condition develops is not fully understood. RISK FACTORS Certain activities or behaviors can put you at an increased risk of developing bacterial vaginosis, including:  Having a new sex partner or multiple sex partners.  Douching.  Using an intrauterine device (IUD) for contraception. Women do not get bacterial vaginosis from toilet seats, bedding, swimming pools, or contact with objects around them. SIGNS AND SYMPTOMS  Some women with bacterial vaginosis have no signs or symptoms. Common symptoms include:  Grey vaginal discharge.  A fishlike odor with discharge, especially after sexual intercourse.  Itching or burning of the vagina and vulva.  Burning or pain with urination. DIAGNOSIS  Your health care provider will take a medical history and examine the vagina for signs of bacterial vaginosis. A sample of vaginal fluid may be taken. Your health care provider will look at this sample under a microscope to check for bacteria and abnormal cells. A vaginal pH test may also be done.  TREATMENT  Bacterial vaginosis may be treated with antibiotic medicines. These may be given in the form of a pill or a vaginal cream. A second round of antibiotics may be prescribed if the condition comes back after treatment.  HOME CARE INSTRUCTIONS   Only take over-the-counter or prescription medicines as  directed by your health care provider.  If antibiotic medicine was prescribed, take it as directed. Make sure you finish it even if you start to feel better.  Do not have sex until treatment is completed.  Tell all sexual partners that you have a vaginal infection. They should see their health care provider and be treated if they have problems, such as a mild rash or itching.  Practice safe sex by using condoms and only having one sex partner. SEEK MEDICAL CARE IF:   Your symptoms are not improving after 3 days of treatment.  You have increased discharge or pain.  You have a fever. MAKE SURE YOU:   Understand these instructions.  Will watch your condition.  Will get help right away if you are not doing well or get worse. FOR MORE INFORMATION  Centers for Disease Control and Prevention, Division of STD Prevention: Appraiser[employer]raud[employer] American Sexual Health Association (ASHA): www.ashastd.org  Document Released: 08/17/2005 Document Revised: 06/07/2013 Document Reviewed: 03/29/2013 [facility] Patient Information 2014 [place].   Back Pain, Adult Low back pain is very common. About 1 in 5 people have back pain.The cause of low back pain is rarely dangerous. The pain often gets better over time.About half of people with a sudden onset of back pain feel better in just 2 weeks. About 8 in 10 people feel better by 6 weeks.  CAUSES Some common causes of back pain include:  Strain of the muscles or ligaments supporting the spine.  Wear and tear (degeneration) of the spinal discs.  Arthritis.  Direct injury to the back. DIAGNOSIS Most of the time, the direct cause of low back pain is not known.However, back pain can  be treated effectively even when the exact cause of the pain is unknown.Answering your caregiver's questions about your overall health and symptoms is one of the most accurate ways to make sure the cause of your pain is not dangerous. If your caregiver needs more  information, he or she may order lab work or imaging tests (X-rays or MRIs).However, even if imaging tests show changes in your back, this usually does not require surgery. HOME CARE INSTRUCTIONS For many people, back pain returns.Since low back pain is rarely dangerous, it is often a condition that people can learn to [facility] their own.   Remain active. It is stressful on the back to sit or stand in one place. Do not sit, drive, or stand in one place for more than 30 minutes at a time. Take short walks on level surfaces as soon as pain allows.Try to increase the length of time you walk each day.  Do not stay in bed.Resting more than 1 or 2 days can delay your recovery.  Do not avoid exercise or work.Your body is made to move.It is not dangerous to be active, even though your back may hurt.Your back will likely heal faster if you return to being active before your pain is gone.  Pay attention to your body when you bend and lift. Many people have less discomfortwhen lifting if they bend their knees, keep the load close to their bodies,and avoid twisting. Often, the most comfortable positions are those that put less stress on your recovering back.  Find a comfortable position to sleep. Use a firm mattress and lie on your side with your knees slightly bent. If you lie on your back, put a pillow under your knees.  Only take over-the-counter or prescription medicines as directed by your caregiver. Over-the-counter medicines to reduce pain and inflammation are often the most helpful.Your caregiver may prescribe muscle relaxant drugs.These medicines help dull your pain so you can more quickly return to your normal activities and healthy exercise.  Put ice on the injured area.  Put ice in a plastic bag.  Place a towel between your skin and the bag.  Leave the ice on for 15-20 minutes, 03-04 times a day for the first 2 to 3 days. After that, ice and heat may be alternated to reduce pain  and spasms.  Ask your caregiver about trying back exercises and gentle massage. This may be of some benefit.  Avoid feeling anxious or stressed.Stress increases muscle tension and can worsen back pain.It is important to recognize when you are anxious or stressed and learn ways to manage it.Exercise is a great option. SEEK MEDICAL CARE IF:  You have pain that is not relieved with rest or medicine.  You have pain that does not improve in 1 week.  You have new symptoms.  You are generally not feeling well. SEEK IMMEDIATE MEDICAL CARE IF:   You have pain that radiates from your back into your legs.  You develop new bowel or bladder control problems.  You have unusual weakness or numbness in your arms or legs.  You develop nausea or vomiting.  You develop abdominal pain.  You feel faint. Document Released: 08/17/2005 Document Revised: 02/16/2012 Document Reviewed: 01/05/2011 [facility] Patient Information 2014 [place], [place].

## 2013-12-12 LAB — GC/CHLAMYDIA PROBE AMP
CT PROBE, AMP APTIMA: NEGATIVE
GC PROBE AMP APTIMA: NEGATIVE

## 2013-12-15 ENCOUNTER — Other Ambulatory Visit: Payer: Self-pay | Admitting: Advanced Practice Midwife

## 2013-12-15 MED ORDER — METRONIDAZOLE 500 MG PO TABS: 500.0000 mg | ORAL_TABLET | Freq: Two times a day (BID) | ORAL | Status: DC

## 2014-06-28 ENCOUNTER — Encounter (HOSPITAL_COMMUNITY): Payer: Self-pay | Admitting: General Practice

## 2014-06-28 ENCOUNTER — Inpatient Hospital Stay (HOSPITAL_COMMUNITY)
Admission: AD | Admit: 2014-06-28 | Discharge: 2014-06-28 | Disposition: A | Discharge status: Home / Self Care | Payer: Medicaid Other | Source: Ambulatory Visit | Attending: Obstetrics and Gynecology | Admitting: Obstetrics and Gynecology

## 2014-06-28 DIAGNOSIS — N390 Urinary tract infection, site not specified: Secondary | ICD-10-CM

## 2014-06-28 DIAGNOSIS — R319 Hematuria, unspecified: Secondary | ICD-10-CM

## 2014-06-28 HISTORY — DX: Urinary tract infection, site not specified: N39.0

## 2014-06-28 LAB — URINALYSIS, ROUTINE W REFLEX MICROSCOPIC
BILIRUBIN URINE: NEGATIVE
Glucose, UA: NEGATIVE mg/dL
Ketones, ur: NEGATIVE mg/dL
NITRITE: NEGATIVE
PH: 8.5 — AB (ref 5.0–8.0)
Protein, ur: 30 mg/dL — AB
SPECIFIC GRAVITY, URINE: 1.02 (ref 1.005–1.030)
Urobilinogen, UA: 1 mg/dL (ref 0.0–1.0)

## 2014-06-28 LAB — POCT PREGNANCY, URINE: Preg Test, Ur: NEGATIVE

## 2014-06-28 LAB — URINE MICROSCOPIC-ADD ON

## 2014-06-28 MED ORDER — NITROFURANTOIN MONOHYD MACRO 100 MG PO CAPS: 100.0000 mg | ORAL_CAPSULE | Freq: Two times a day (BID) | ORAL | Status: AC
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
## 2014-06-28 NOTE — Discharge Instructions (Signed)

## 2014-06-28 NOTE — MAU Note (Signed)
Pt reports she is feeling pressure after she urinates x 4 days getting worse and noticed some spotting as well.

## 2014-06-30 LAB — CULTURE, OB URINE: Colony Count: >=100000

## 2014-07-02 ENCOUNTER — Encounter (HOSPITAL_COMMUNITY): Payer: Self-pay | Admitting: General Practice

## 2015-02-19 ENCOUNTER — Encounter (HOSPITAL_COMMUNITY): Payer: Self-pay | Admitting: *Deleted

## 2015-02-19 ENCOUNTER — Inpatient Hospital Stay (HOSPITAL_COMMUNITY): Payer: Medicaid Other

## 2015-02-19 ENCOUNTER — Inpatient Hospital Stay (HOSPITAL_COMMUNITY)
Admission: AD | Admit: 2015-02-19 | Discharge: 2015-02-19 | Disposition: A | Discharge status: Home / Self Care | Payer: Medicaid Other | Source: Ambulatory Visit | Attending: Family Medicine | Admitting: Family Medicine

## 2015-02-19 DIAGNOSIS — Z3A08 8 weeks gestation of pregnancy: Secondary | ICD-10-CM | POA: Insufficient documentation

## 2015-02-19 DIAGNOSIS — R109 Unspecified abdominal pain: Secondary | ICD-10-CM

## 2015-02-19 DIAGNOSIS — R1032 Left lower quadrant pain: Secondary | ICD-10-CM | POA: Diagnosis present

## 2015-02-19 DIAGNOSIS — O211 Hyperemesis gravidarum with metabolic disturbance: Secondary | ICD-10-CM | POA: Diagnosis not present

## 2015-02-19 DIAGNOSIS — O98311 Other infections with a predominantly sexual mode of transmission complicating pregnancy, first trimester: Secondary | ICD-10-CM | POA: Diagnosis not present

## 2015-02-19 DIAGNOSIS — O26899 Other specified pregnancy related conditions, unspecified trimester: Secondary | ICD-10-CM

## 2015-02-19 DIAGNOSIS — A5901 Trichomonal vulvovaginitis: Secondary | ICD-10-CM | POA: Insufficient documentation

## 2015-02-19 LAB — CBC
HCT: 32.2 % — ABNORMAL LOW (ref 36.0–46.0)
Hemoglobin: 11.3 g/dL — ABNORMAL LOW (ref 12.0–15.0)
MCH: 31 pg (ref 26.0–34.0)
MCHC: 35.1 g/dL (ref 30.0–36.0)
MCV: 88.5 fL (ref 78.0–100.0)
Platelets: 256 10*3/uL (ref 150–400)
RBC: 3.64 MIL/uL — ABNORMAL LOW (ref 3.87–5.11)
RDW: 12.3 % (ref 11.5–15.5)
WBC: 7.5 10*3/uL (ref 4.0–10.5)

## 2015-02-19 LAB — WET PREP, GENITAL
CLUE CELLS WET PREP: NONE SEEN
YEAST WET PREP: NONE SEEN

## 2015-02-19 LAB — COMPREHENSIVE METABOLIC PANEL
ALK PHOS: 32 U/L — AB (ref 38–126)
ALT: 20 U/L (ref 14–54)
AST: 18 U/L (ref 15–41)
Albumin: 3.6 g/dL (ref 3.5–5.0)
Anion gap: 5 (ref 5–15)
BUN: 11 mg/dL (ref 6–20)
CO2: 23 mmol/L (ref 22–32)
CREATININE: 0.63 mg/dL (ref 0.44–1.00)
Calcium: 8.7 mg/dL — ABNORMAL LOW (ref 8.9–10.3)
Chloride: 104 mmol/L (ref 101–111)
Glucose, Bld: 276 mg/dL — ABNORMAL HIGH (ref 65–99)
Potassium: 3.7 mmol/L (ref 3.5–5.1)
SODIUM: 132 mmol/L — AB (ref 135–145)
Total Bilirubin: 0.5 mg/dL (ref 0.3–1.2)
Total Protein: 6.7 g/dL (ref 6.5–8.1)

## 2015-02-19 LAB — URINALYSIS, ROUTINE W REFLEX MICROSCOPIC
Bilirubin Urine: NEGATIVE
Glucose, UA: NEGATIVE mg/dL
Ketones, ur: >80 mg/dL — AB
Nitrite: NEGATIVE
PROTEIN: NEGATIVE mg/dL
Specific Gravity, Urine: >1.03 — ABNORMAL HIGH (ref 1.005–1.030)
UROBILINOGEN UA: 0.2 mg/dL (ref 0.0–1.0)
pH: 6 (ref 5.0–8.0)

## 2015-02-19 LAB — URINE MICROSCOPIC-ADD ON

## 2015-02-19 LAB — HCG, QUANTITATIVE, PREGNANCY: hCG, Beta Chain, Quant, S: 390903 m[IU]/mL — ABNORMAL HIGH (ref <5)

## 2015-02-19 LAB — POCT PREGNANCY, URINE: PREG TEST UR: POSITIVE — AB

## 2015-02-19 MED ORDER — ONDANSETRON 8 MG PO TBDP
8.0000 mg | ORAL_TABLET | Freq: Once | ORAL | Status: AC
  Administered 2015-02-19: 8 mg via ORAL
  Filled 2015-02-19: qty 1

## 2015-02-19 MED ORDER — SODIUM CHLORIDE 0.9 % IV SOLN
Freq: Once | INTRAVENOUS | Status: AC
  Administered 2015-02-19: 19:00:00 via INTRAVENOUS
  Filled 2015-02-19: qty 1000

## 2015-02-19 MED ORDER — METRONIDAZOLE 500 MG PO TABS
2000.0000 mg | ORAL_TABLET | Freq: Once | ORAL | Status: AC
  Administered 2015-02-19: 2000 mg via ORAL
  Filled 2015-02-19: qty 4

## 2015-02-19 MED ORDER — ONDANSETRON HCL 4 MG PO TABS: 4.0000 mg | ORAL_TABLET | Freq: Four times a day (QID) | ORAL | Status: DC

## 2015-02-19 MED ORDER — MECLIZINE HCL 25 MG PO TABS: ORAL_TABLET | ORAL | Status: DC

## 2015-02-19 MED ORDER — DEXTROSE 5 % IN LACTATED RINGERS IV BOLUS
1000.0000 mL | Freq: Once | INTRAVENOUS | Status: AC
  Administered 2015-02-19: 1000 mL via INTRAVENOUS
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
## 2015-02-19 NOTE — Discharge Instructions (Signed)
________________________________________     To schedule your Maternity Eligibility Appointment, please call [phone].  When you arrive for your appointment you must bring the following items or information listed below.  Your appointment will be rescheduled if you do not have these items or are 15 minutes late. If currently receiving Medicaid, you MUST bring: 1. Medicaid Card 2. Social Security Card 3. Picture ID 4. Proof of Pregnancy 5. Verification of current address if the address on Medicaid card is incorrect "postmarked mail" If not receiving Medicaid, you MUST bring: 1. Social Security Card 2. Picture ID 3. Birth Certificate (if available) Passport or *Green Card 4. Proof of Pregnancy 5. Verification of current address "postmarked mail" for each income presented. 6. Verification of insurance coverage, if any 7. Check stubs from each employer for the previous month (if unable to present check stub  for each week, we will accept check stub for the first and last week ill the same month.) If you can't locate check stubs, you must bring a letter from the employer(s) and it must have the following information on letterhead, typed, in English: o name of [place] telephone number o how long been with the company, if less than one month o how much person earns per hour o how many hours per week work o the gross pay the person earned for the previous month If you are 33 years old or less, you do not have to bring proof of income unless you work or live with the father of the baby and at that time we will need proof of income from you and/or the father of the baby. Green Card recipients are eligible for Medicaid for Pregnant Women (MPW)  Safe Medications in Pregnancy   Acne: Benzoyl Peroxide Salicylic Acid  Backache/Headache: Tylenol: 2 regular strength every 4 hours OR              2 Extra strength every 6 hours  Colds/Coughs/Allergies: Benadryl (alcohol free)  25 mg every 6 hours as needed Breath right strips Claritin Cepacol throat lozenges Chloraseptic throat spray Cold-Eeze- up to three times per day Cough drops, alcohol free Flonase (by prescription only) Guaifenesin Mucinex Robitussin DM (plain only, alcohol free) Saline nasal spray/drops Sudafed (pseudoephedrine) & Actifed ** use only after [redacted] weeks gestation and if you do not have high blood pressure Tylenol Vicks Vaporub Zinc lozenges Zyrtec   Constipation: Colace Ducolax suppositories Fleet enema Glycerin suppositories Metamucil Milk of magnesia Miralax Senokot Smooth move tea  Diarrhea: Kaopectate Imodium A-D  *NO pepto Bismol  Hemorrhoids: Anusol Anusol HC Preparation H Tucks  Indigestion: Tums Maalox Mylanta Zantac  Pepcid  Insomnia: Benadryl (alcohol free) 25mg  every 6 hours as needed Tylenol PM Unisom, no Gelcaps  Leg Cramps: Tums MagGel  Nausea/Vomiting:  Bonine Dramamine Emetrol Ginger extract Sea bands Meclizine  Nausea medication to take during pregnancy:  Unisom (doxylamine succinate 25 mg tablets) Take one tablet daily at bedtime. If symptoms are not adequately controlled, the dose can be increased to a maximum recommended dose of two tablets daily (1/2 tablet in the morning, 1/2 tablet mid-afternoon and one at bedtime). Vitamin B6 100mg  tablets. Take one tablet twice a day (up to 200 mg per day).  Skin Rashes: Aveeno products Benadryl cream or 25mg  every 6 hours as needed Calamine Lotion 1% cortisone cream  Yeast infection: Gyne-lotrimin 7 Monistat 7   **If taking multiple medications, please check labels to avoid duplicating the same active ingredients **take medication as  directed on the label ** Do not exceed 4000 mg of tylenol in 24 hours **Do not take medications that contain aspirin or ibuprofen

## 2015-02-19 NOTE — MAU Note (Signed)
Pt presents to MAU with complaints of nausea and vomiting for a couple of weeks, states she can't keep anything down. +HPT a couple of weeks ago. Denies any vaginal bleeding or discharge

## 2015-02-20 LAB — GC/CHLAMYDIA PROBE AMP (~~LOC~~) NOT AT ARMC
CHLAMYDIA, DNA PROBE: NEGATIVE
NEISSERIA GONORRHEA: NEGATIVE

## 2015-02-20 LAB — RPR: RPR: NONREACTIVE

## 2015-02-20 LAB — HIV ANTIBODY (ROUTINE TESTING W REFLEX): HIV Screen 4th Generation wRfx: NONREACTIVE

## 2015-03-05 ENCOUNTER — Inpatient Hospital Stay (HOSPITAL_COMMUNITY)
Admission: AD | Admit: 2015-03-05 | Discharge: 2015-03-06 | Disposition: A | Discharge status: Home / Self Care | Payer: Medicaid Other | Source: Ambulatory Visit | Attending: Family Medicine | Admitting: Family Medicine

## 2015-03-05 ENCOUNTER — Encounter (HOSPITAL_COMMUNITY): Payer: Self-pay | Admitting: Family

## 2015-03-05 DIAGNOSIS — O210 Mild hyperemesis gravidarum: Secondary | ICD-10-CM | POA: Diagnosis present

## 2015-03-05 DIAGNOSIS — O219 Vomiting of pregnancy, unspecified: Secondary | ICD-10-CM | POA: Diagnosis not present

## 2015-03-05 DIAGNOSIS — Z3A11 11 weeks gestation of pregnancy: Secondary | ICD-10-CM | POA: Diagnosis not present

## 2015-03-05 LAB — CBC
HCT: 37.4 % (ref 36.0–46.0)
Hemoglobin: 13.2 g/dL (ref 12.0–15.0)
MCH: 31.1 pg (ref 26.0–34.0)
MCHC: 35.3 g/dL (ref 30.0–36.0)
MCV: 88.2 fL (ref 78.0–100.0)
PLATELETS: 310 10*3/uL (ref 150–400)
RBC: 4.24 MIL/uL (ref 3.87–5.11)
RDW: 12.5 % (ref 11.5–15.5)
WBC: 10.2 10*3/uL (ref 4.0–10.5)

## 2015-03-05 LAB — URINE MICROSCOPIC-ADD ON

## 2015-03-05 LAB — URINALYSIS, ROUTINE W REFLEX MICROSCOPIC
Glucose, UA: NEGATIVE mg/dL
Ketones, ur: >80 mg/dL — AB
Leukocytes, UA: NEGATIVE
Nitrite: NEGATIVE
PH: 6 (ref 5.0–8.0)
Protein, ur: NEGATIVE mg/dL
Specific Gravity, Urine: >1.03 — ABNORMAL HIGH (ref 1.005–1.030)
Urobilinogen, UA: 1 mg/dL (ref 0.0–1.0)

## 2015-03-05 MED ORDER — METOCLOPRAMIDE HCL 5 MG/ML IJ SOLN
10.0000 mg | Freq: Once | INTRAMUSCULAR | Status: AC
  Administered 2015-03-06: 10 mg via INTRAVENOUS
  Filled 2015-03-05: qty 2

## 2015-03-05 MED ORDER — DEXTROSE IN LACTATED RINGERS 5 % IV SOLN
Freq: Once | INTRAVENOUS | Status: AC
  Administered 2015-03-05: via INTRAVENOUS
  Filled 2015-03-05: qty 10
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
## 2015-03-05 NOTE — MAU Note (Signed)
Pt reports she has not had any food for 3 days, vomiting, feels dizzy. Right lower abd pain x one week, is lessening in severity.

## 2015-03-06 DIAGNOSIS — O219 Vomiting of pregnancy, unspecified: Secondary | ICD-10-CM | POA: Diagnosis not present

## 2015-03-06 LAB — COMPREHENSIVE METABOLIC PANEL
ALBUMIN: 4.1 g/dL (ref 3.5–5.0)
ALK PHOS: 32 U/L — AB (ref 38–126)
ALT: 17 U/L (ref 14–54)
ANION GAP: 9 (ref 5–15)
AST: 20 U/L (ref 15–41)
BUN: 11 mg/dL (ref 6–20)
CHLORIDE: 104 mmol/L (ref 101–111)
CO2: 22 mmol/L (ref 22–32)
CREATININE: 0.68 mg/dL (ref 0.44–1.00)
Calcium: 9.2 mg/dL (ref 8.9–10.3)
GFR calc Af Amer: >60 mL/min (ref >60)
GFR calc non Af Amer: >60 mL/min (ref >60)
Glucose, Bld: 80 mg/dL (ref 65–99)
POTASSIUM: 3.8 mmol/L (ref 3.5–5.1)
Sodium: 135 mmol/L (ref 135–145)
Total Bilirubin: 0.7 mg/dL (ref 0.3–1.2)
Total Protein: 7.8 g/dL (ref 6.5–8.1)

## 2015-03-06 MED ORDER — METOCLOPRAMIDE HCL 10 MG PO TABS: 10.0000 mg | ORAL_TABLET | Freq: Three times a day (TID) | ORAL | Status: DC

## 2015-03-06 NOTE — Discharge Instructions (Signed)

## 2015-04-04 ENCOUNTER — Emergency Department (HOSPITAL_COMMUNITY)
Admission: EM | Admit: 2015-04-04 | Discharge: 2015-04-04 | Disposition: A | Discharge status: Home / Self Care | Payer: No Typology Code available for payment source | Attending: Emergency Medicine | Admitting: Emergency Medicine

## 2015-04-04 ENCOUNTER — Ambulatory Visit (INDEPENDENT_AMBULATORY_CARE_PROVIDER_SITE_OTHER): Payer: Medicaid Other | Admitting: Family

## 2015-04-04 ENCOUNTER — Emergency Department (HOSPITAL_COMMUNITY): Payer: No Typology Code available for payment source

## 2015-04-04 ENCOUNTER — Encounter (HOSPITAL_COMMUNITY): Payer: Self-pay | Admitting: Emergency Medicine

## 2015-04-04 VITALS — BP 135/78 | HR 106 | Wt 143.0 lb

## 2015-04-04 DIAGNOSIS — S3991XA Unspecified injury of abdomen, initial encounter: Secondary | ICD-10-CM | POA: Insufficient documentation

## 2015-04-04 DIAGNOSIS — R1030 Lower abdominal pain, unspecified: Secondary | ICD-10-CM

## 2015-04-04 DIAGNOSIS — R011 Cardiac murmur, unspecified: Secondary | ICD-10-CM | POA: Diagnosis not present

## 2015-04-04 DIAGNOSIS — O9989 Other specified diseases and conditions complicating pregnancy, childbirth and the puerperium: Secondary | ICD-10-CM | POA: Insufficient documentation

## 2015-04-04 DIAGNOSIS — Z91040 Latex allergy status: Secondary | ICD-10-CM | POA: Insufficient documentation

## 2015-04-04 DIAGNOSIS — Y9389 Activity, other specified: Secondary | ICD-10-CM | POA: Insufficient documentation

## 2015-04-04 DIAGNOSIS — O99012 Anemia complicating pregnancy, second trimester: Secondary | ICD-10-CM | POA: Diagnosis not present

## 2015-04-04 DIAGNOSIS — Z87440 Personal history of urinary (tract) infections: Secondary | ICD-10-CM | POA: Insufficient documentation

## 2015-04-04 DIAGNOSIS — S3992XA Unspecified injury of lower back, initial encounter: Secondary | ICD-10-CM | POA: Insufficient documentation

## 2015-04-04 DIAGNOSIS — S199XXA Unspecified injury of neck, initial encounter: Secondary | ICD-10-CM | POA: Diagnosis not present

## 2015-04-04 DIAGNOSIS — O9A212 Injury, poisoning and certain other consequences of external causes complicating pregnancy, second trimester: Secondary | ICD-10-CM | POA: Insufficient documentation

## 2015-04-04 DIAGNOSIS — S0990XA Unspecified injury of head, initial encounter: Secondary | ICD-10-CM | POA: Insufficient documentation

## 2015-04-04 DIAGNOSIS — Z79899 Other long term (current) drug therapy: Secondary | ICD-10-CM | POA: Insufficient documentation

## 2015-04-04 DIAGNOSIS — Z8742 Personal history of other diseases of the female genital tract: Secondary | ICD-10-CM | POA: Diagnosis not present

## 2015-04-04 DIAGNOSIS — Y92410 Unspecified street and highway as the place of occurrence of the external cause: Secondary | ICD-10-CM | POA: Diagnosis not present

## 2015-04-04 DIAGNOSIS — Y998 Other external cause status: Secondary | ICD-10-CM | POA: Insufficient documentation

## 2015-04-04 DIAGNOSIS — Z3A15 15 weeks gestation of pregnancy: Secondary | ICD-10-CM | POA: Diagnosis not present

## 2015-04-04 DIAGNOSIS — O0992 Supervision of high risk pregnancy, unspecified, second trimester: Secondary | ICD-10-CM

## 2015-04-04 LAB — CBC WITH DIFFERENTIAL/PLATELET
Basophils Absolute: 0 10*3/uL (ref 0.0–0.1)
Basophils Relative: 0 % (ref 0–1)
Eosinophils Absolute: 0 10*3/uL (ref 0.0–0.7)
Eosinophils Relative: 0 % (ref 0–5)
HCT: 32.6 % — ABNORMAL LOW (ref 36.0–46.0)
Hemoglobin: 11.3 g/dL — ABNORMAL LOW (ref 12.0–15.0)
LYMPHS PCT: 23 % (ref 12–46)
Lymphs Abs: 1.7 10*3/uL (ref 0.7–4.0)
MCH: 30.5 pg (ref 26.0–34.0)
MCHC: 34.7 g/dL (ref 30.0–36.0)
MCV: 87.9 fL (ref 78.0–100.0)
MONO ABS: 0.4 10*3/uL (ref 0.1–1.0)
MONOS PCT: 5 % (ref 3–12)
NEUTROS PCT: 72 % (ref 43–77)
Neutro Abs: 5.5 10*3/uL (ref 1.7–7.7)
PLATELETS: 281 10*3/uL (ref 150–400)
RBC: 3.71 MIL/uL — ABNORMAL LOW (ref 3.87–5.11)
RDW: 12.9 % (ref 11.5–15.5)
WBC: 7.6 10*3/uL (ref 4.0–10.5)

## 2015-04-04 LAB — COMPREHENSIVE METABOLIC PANEL
ALBUMIN: 3.2 g/dL — AB (ref 3.5–5.0)
ALT: 13 U/L — ABNORMAL LOW (ref 14–54)
AST: 18 U/L (ref 15–41)
Alkaline Phosphatase: 30 U/L — ABNORMAL LOW (ref 38–126)
Anion gap: 8 (ref 5–15)
BUN: 8 mg/dL (ref 6–20)
CO2: 22 mmol/L (ref 22–32)
CREATININE: 0.55 mg/dL (ref 0.44–1.00)
Calcium: 8.5 mg/dL — ABNORMAL LOW (ref 8.9–10.3)
Chloride: 104 mmol/L (ref 101–111)
GFR calc Af Amer: >60 mL/min (ref >60)
GFR calc non Af Amer: >60 mL/min (ref >60)
Glucose, Bld: 86 mg/dL (ref 65–99)
POTASSIUM: 3.2 mmol/L — AB (ref 3.5–5.1)
Sodium: 134 mmol/L — ABNORMAL LOW (ref 135–145)
TOTAL PROTEIN: 6 g/dL — AB (ref 6.5–8.1)
Total Bilirubin: 0.3 mg/dL (ref 0.3–1.2)

## 2015-04-04 LAB — POCT URINALYSIS DIP (DEVICE)
Glucose, UA: NEGATIVE mg/dL
KETONES UR: 40 mg/dL — AB
Nitrite: NEGATIVE
Protein, ur: 30 mg/dL — AB
Specific Gravity, Urine: >=1.03 (ref 1.005–1.030)
Urobilinogen, UA: 1 mg/dL (ref 0.0–1.0)
pH: 6 (ref 5.0–8.0)

## 2015-04-04 MED ORDER — SODIUM CHLORIDE 0.9 % IV BOLUS (SEPSIS)
1000.0000 mL | Freq: Once | INTRAVENOUS | Status: AC
  Administered 2015-04-04: 1000 mL via INTRAVENOUS

## 2015-04-04 MED ORDER — ACETAMINOPHEN 500 MG PO TABS
1000.0000 mg | ORAL_TABLET | Freq: Once | ORAL | Status: AC
  Administered 2015-04-04: 1000 mg via ORAL
  Filled 2015-04-04: qty 2
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
## 2015-04-04 NOTE — Discharge Instructions (Signed)
Abdominal Pain During Pregnancy Abdominal pain is common in pregnancy. Most of the time, it does not cause harm. There are many causes of abdominal pain. Some causes are more serious than others. Some of the causes of abdominal pain in pregnancy are easily diagnosed. Occasionally, the diagnosis takes time to understand. Other times, the cause is not determined. Abdominal pain can be a sign that something is very wrong with the pregnancy, or the pain may have nothing to do with the pregnancy at all. For this reason, always tell your health care provider if you have any abdominal discomfort. HOME CARE INSTRUCTIONS  Monitor your abdominal pain for any changes. The following actions may help to alleviate any discomfort you are experiencing:  Do not have sexual intercourse or put anything in your vagina until your symptoms go away completely.  Get plenty of rest until your pain improves.  Drink clear fluids if you feel nauseous. Avoid solid food as long as you are uncomfortable or nauseous.  Only take over-the-counter or prescription medicine as directed by your health care provider.  Keep all follow-up appointments with your health care provider. SEEK IMMEDIATE MEDICAL CARE IF:  You are bleeding, leaking fluid, or passing tissue from the vagina.  You have increasing pain or cramping.  You have persistent vomiting.  You have painful or bloody urination.  You have a fever.  You notice a decrease in your baby's movements.  You have extreme weakness or feel faint.  You have shortness of breath, with or without abdominal pain.  You develop a severe headache with abdominal pain.  You have abnormal vaginal discharge with abdominal pain.  You have persistent diarrhea.  You have abdominal pain that continues even after rest, or gets worse. MAKE SURE YOU:   Understand these instructions.  Will watch your condition.  Will get help right away if you are not doing well or get  worse. Document Released: 08/17/2005 Document Revised: 06/07/2013 Document Reviewed: 03/16/2013 [facility] Patient Information 2015 [place], [place]. This information is not intended to replace advice given to you by your health care provider. Make sure you discuss any questions you have with your health care provider.    Motor Vehicle Collision It is common to have multiple bruises and sore muscles after a motor vehicle collision (MVC). These tend to feel worse for the first 24 hours. You may have the most stiffness and soreness over the first several hours. You may also feel worse when you wake up the first morning after your collision. After this point, you will usually begin to improve with each day. The speed of improvement often depends on the severity of the collision, the number of injuries, and the location and nature of these injuries. HOME CARE INSTRUCTIONS  Put ice on the injured area.  Put ice in a plastic bag.  Place a towel between your skin and the bag.  Leave the ice on for 15-20 minutes, 3-4 times a day, or as directed by your health care provider.  Drink enough fluids to keep your urine clear or pale yellow. Do not drink alcohol.  Take a warm shower or bath once or twice a day. This will increase blood flow to sore muscles.  You may return to activities as directed by your caregiver. Be careful when lifting, as this may aggravate neck or back pain.  Only take over-the-counter or prescription medicines for pain, discomfort, or fever as directed by your caregiver. Do not use aspirin. This may increase bruising and  bleeding. SEEK IMMEDIATE MEDICAL CARE IF:  You have numbness, tingling, or weakness in the arms or legs.  You develop severe headaches not relieved with medicine.  You have severe neck pain, especially tenderness in the middle of the back of your neck.  You have changes in bowel or bladder control.  There is increasing pain in any area of the body.  You  have shortness of breath, light-headedness, dizziness, or fainting.  You have chest pain.  You feel sick to your stomach (nauseous), throw up (vomit), or sweat.  You have increasing abdominal discomfort.  There is blood in your urine, stool, or vomit.  You have pain in your shoulder (shoulder strap areas).  You feel your symptoms are getting worse. MAKE SURE YOU:  Understand these instructions.  Will watch your condition.  Will get help right away if you are not doing well or get worse. Document Released: 08/17/2005 Document Revised: 01/01/2014 Document Reviewed: 01/14/2011 [facility] Patient Information 2015 [place], [place]. This information is not intended to replace advice given to you by your health care provider. Make sure you discuss any questions you have with your health care provider.    Blunt Abdominal Trauma A blunt injury to the abdomen can cause pain. The pain is most likely from bruising and stretching of your muscles. This pain is often made worse with movement. Most often these injuries are not serious and get better within 1 week with rest and mild pain medicine. However, internal organs (liver, spleen, kidneys) can be injured with blunt trauma. If you do not get better or if you get worse, further examination may be needed. Continue with your regular daily activities, but avoid any strenuous activities until your pain is improved. If your stomach is upset, stick to a clear liquid diet and slowly advance to solid food.  SEEK IMMEDIATE MEDICAL CARE IF:   You develop increasing pain, nausea, or repeated vomiting.  You develop chest pain or breathing difficulty.  You develop blood in the urine, vomit, or stool.  You develop weakness, fainting, fever, or other serious complaints. Document Released: 09/24/2004 Document Revised: 11/09/2011 Document Reviewed: 01/10/2009 [facility] Patient Information 2015 [place], [place]. This information is not intended to replace advice  given to you by your health care provider. Make sure you discuss any questions you have with your health care provider.
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
## 2015-04-04 NOTE — ED Notes (Addendum)
Patient transported to [place] at 1026.
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
## 2015-04-08 ENCOUNTER — Encounter (INDEPENDENT_AMBULATORY_CARE_PROVIDER_SITE_OTHER): Payer: Medicaid Other | Admitting: Obstetrics & Gynecology

## 2015-04-08 DIAGNOSIS — Z3482 Encounter for supervision of other normal pregnancy, second trimester: Secondary | ICD-10-CM | POA: Diagnosis present

## 2015-04-15 ENCOUNTER — Encounter: Payer: Self-pay | Admitting: Family Medicine

## 2015-04-15 ENCOUNTER — Ambulatory Visit (INDEPENDENT_AMBULATORY_CARE_PROVIDER_SITE_OTHER): Payer: Medicaid Other | Admitting: Family Medicine

## 2015-04-15 VITALS — BP 123/54 | HR 66 | Temp 98.4°F | Wt 144.8 lb

## 2015-04-15 DIAGNOSIS — Z3482 Encounter for supervision of other normal pregnancy, second trimester: Secondary | ICD-10-CM | POA: Diagnosis not present

## 2015-04-15 DIAGNOSIS — Z3490 Encounter for supervision of normal pregnancy, unspecified, unspecified trimester: Secondary | ICD-10-CM | POA: Insufficient documentation

## 2015-04-15 LAB — POCT URINALYSIS DIP (DEVICE)
Bilirubin Urine: NEGATIVE
GLUCOSE, UA: NEGATIVE mg/dL
KETONES UR: NEGATIVE mg/dL
Leukocytes, UA: NEGATIVE
Nitrite: NEGATIVE
PROTEIN: NEGATIVE mg/dL
SPECIFIC GRAVITY, URINE: 1.02 (ref 1.005–1.030)
Urobilinogen, UA: 0.2 mg/dL (ref 0.0–1.0)
pH: 7 (ref 5.0–8.0)

## 2015-04-15 NOTE — Addendum Note (Signed)
Addended by: Rutherford Nail E on: 04/15/2015 10:39 AM   Modules accepted: Orders

## 2015-04-15 NOTE — Progress Notes (Signed)
Breastfeeding tip of the week reviewed Hgb: small Home Medicaid form completed
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
## 2015-04-15 NOTE — Patient Instructions (Signed)
Second Trimester of Pregnancy The second trimester is from week 13 through week 28, months 4 through 6. The second trimester is often a time when you feel your best. Your body has also adjusted to being pregnant, and you begin to feel better physically. Usually, morning sickness has lessened or quit completely, you may have more energy, and you may have an increase in appetite. The second trimester is also a time when the fetus is growing rapidly. At the end of the sixth month, the fetus is about 9 inches long and weighs about 1 pounds. You will likely begin to feel the baby move (quickening) between 18 and 20 weeks of the pregnancy. BODY CHANGES Your body goes through many changes during pregnancy. The changes vary from woman to woman.   Your weight will continue to increase. You will notice your lower abdomen bulging out.  You may begin to get stretch marks on your hips, abdomen, and breasts.  You may develop headaches that can be relieved by medicines approved by your health care provider.  You may urinate more often because the fetus is pressing on your bladder.  You may develop or continue to have heartburn as a result of your pregnancy.  You may develop constipation because certain hormones are causing the muscles that push waste through your intestines to slow down.  You may develop hemorrhoids or swollen, bulging veins (varicose veins).  You may have back pain because of the weight gain and pregnancy hormones relaxing your joints between the bones in your pelvis and as a result of a shift in weight and the muscles that support your balance.  Your breasts will continue to grow and be tender.  Your gums may bleed and may be sensitive to brushing and flossing.  Dark spots or blotches (chloasma, mask of pregnancy) may develop on your face. This will likely fade after the baby is born.  A dark line from your belly button to the pubic area (linea nigra) may appear. This will likely fade  after the baby is born.  You may have changes in your hair. These can include thickening of your hair, rapid growth, and changes in texture. Some women also have hair loss during or after pregnancy, or hair that feels dry or thin. Your hair will most likely return to normal after your baby is born. WHAT TO EXPECT AT YOUR PRENATAL VISITS During a routine prenatal visit:  You will be weighed to make sure you and the fetus are growing normally.  Your blood pressure will be taken.  Your abdomen will be measured to track your baby's growth.  The fetal heartbeat will be listened to.  Any test results from the previous visit will be discussed. Your health care provider may ask you:  How you are feeling.  If you are feeling the baby move.  If you have had any abnormal symptoms, such as leaking fluid, bleeding, severe headaches, or abdominal cramping.  If you have any questions. Other tests that may be performed during your second trimester include:  Blood tests that check for:  Low iron levels (anemia).  Gestational diabetes (between 24 and 28 weeks).  Rh antibodies.  Urine tests to check for infections, diabetes, or protein in the urine.  An ultrasound to confirm the proper growth and development of the baby.  An amniocentesis to check for possible genetic problems.  Fetal screens for spina bifida and Down syndrome. HOME CARE INSTRUCTIONS   Avoid all smoking, herbs, alcohol, and unprescribed   drugs. These chemicals affect the formation and growth of the baby.  Follow your health care provider's instructions regarding medicine use. There are medicines that are either safe or unsafe to take during pregnancy.  Exercise only as directed by your health care provider. Experiencing uterine cramps is a good sign to stop exercising.  Continue to eat regular, healthy meals.  Wear a good support bra for breast tenderness.  Do not use hot tubs, steam rooms, or saunas.  Wear your  seat belt at all times when driving.  Avoid raw meat, uncooked cheese, cat litter boxes, and soil used by cats. These carry germs that can cause birth defects in the baby.  Take your prenatal vitamins.  Try taking a stool softener (if your health care provider approves) if you develop constipation. Eat more high-fiber foods, such as fresh vegetables or fruit and whole grains. Drink plenty of fluids to keep your urine clear or pale yellow.  Take warm sitz baths to soothe any pain or discomfort caused by hemorrhoids. Use hemorrhoid cream if your health care provider approves.  If you develop varicose veins, wear support hose. Elevate your feet for 15 minutes, 3-4 times a day. Limit salt in your diet.  Avoid heavy lifting, wear low heel shoes, and practice good posture.  Rest with your legs elevated if you have leg cramps or low back pain.  Visit your dentist if you have not gone yet during your pregnancy. Use a soft toothbrush to brush your teeth and be gentle when you floss.  A sexual relationship may be continued unless your health care provider directs you otherwise.  Continue to go to all your prenatal visits as directed by your health care provider. SEEK MEDICAL CARE IF:   You have dizziness.  You have mild pelvic cramps, pelvic pressure, or nagging pain in the abdominal area.  You have persistent nausea, vomiting, or diarrhea.  You have a bad smelling vaginal discharge.  You have pain with urination. SEEK IMMEDIATE MEDICAL CARE IF:   You have a fever.  You are leaking fluid from your vagina.  You have spotting or bleeding from your vagina.  You have severe abdominal cramping or pain.  You have rapid weight gain or loss.  You have shortness of breath with chest pain.  You notice sudden or extreme swelling of your face, hands, ankles, feet, or legs.  You have not felt your baby move in over an hour.  You have severe headaches that do not go away with  medicine.  You have vision changes. Document Released: 08/11/2001 Document Revised: 08/22/2013 Document Reviewed: 10/18/2012 Exit[employer] Patient Information 2015 [employer]. This information is not intended to replace advice given to you by your health care provider. Make sure you discuss any questions you have with your health care provider.  

## 2015-04-16 LAB — PRENATAL PROFILE (SOLSTAS)
Antibody Screen: NEGATIVE
Basophils Absolute: 0 10*3/uL (ref 0.0–0.1)
Basophils Relative: 0 % (ref 0–1)
Eosinophils Absolute: 0.2 10*3/uL (ref 0.0–0.7)
Eosinophils Relative: 2 % (ref 0–5)
HCT: 32.6 % — ABNORMAL LOW (ref 36.0–46.0)
HIV 1&2 Ab, 4th Generation: NONREACTIVE
Hemoglobin: 11.2 g/dL — ABNORMAL LOW (ref 12.0–15.0)
Hepatitis B Surface Ag: NEGATIVE
LYMPHS ABS: 2.3 10*3/uL (ref 0.7–4.0)
Lymphocytes Relative: 29 % (ref 12–46)
MCH: 30.8 pg (ref 26.0–34.0)
MCHC: 34.4 g/dL (ref 30.0–36.0)
MCV: 89.6 fL (ref 78.0–100.0)
MONOS PCT: 6 % (ref 3–12)
MPV: 8.5 fL — ABNORMAL LOW (ref 8.6–12.4)
Monocytes Absolute: 0.5 10*3/uL (ref 0.1–1.0)
NEUTROS ABS: 5.1 10*3/uL (ref 1.7–7.7)
Neutrophils Relative %: 63 % (ref 43–77)
PLATELETS: 314 10*3/uL (ref 150–400)
RBC: 3.64 MIL/uL — ABNORMAL LOW (ref 3.87–5.11)
RDW: 13.6 % (ref 11.5–15.5)
RUBELLA: 4.78 {index} — AB (ref <0.90)
Rh Type: POSITIVE
WBC: 8.1 10*3/uL (ref 4.0–10.5)

## 2015-04-16 LAB — GC/CHLAMYDIA PROBE AMP
CT Probe RNA: NEGATIVE
GC Probe RNA: NEGATIVE

## 2015-04-17 LAB — HEMOGLOBINOPATHY EVALUATION
HGB A: 97.1 % (ref 96.8–97.8)
HGB S QUANTITAION: 0 %
Hemoglobin Other: 0 %
Hgb A2 Quant: 2.9 % (ref 2.2–3.2)
Hgb F Quant: 0 % (ref 0.0–2.0)

## 2015-04-18 LAB — AFP, QUAD SCREEN
AFP: 43 ng/mL
Age Alone: 1:441 {titer}
Curr Gest Age: 16.4 wks.days
Down Syndrome Scr Risk Est: 1:6280 {titer}
HCG, Total: 51.46 IU/mL
INH: 137.6 pg/mL
Interpretation-AFP: NEGATIVE
MOM FOR AFP: 1.02
MOM FOR HCG: 1.29
MoM for INH: 0.79
OPEN SPINA BIFIDA: NEGATIVE
Osb Risk: 1:24900 {titer}
Tri 18 Scr Risk Est: NEGATIVE
uE3 Mom: 1.06
uE3 Value: 1.02 ng/mL

## 2015-04-25 ENCOUNTER — Ambulatory Visit (HOSPITAL_COMMUNITY)
Admission: RE | Admit: 2015-04-25 | Discharge: 2015-04-25 | Disposition: A | Discharge status: Home / Self Care | Payer: Medicaid Other | Source: Ambulatory Visit | Attending: Family | Admitting: Family

## 2015-04-25 DIAGNOSIS — O0992 Supervision of high risk pregnancy, unspecified, second trimester: Secondary | ICD-10-CM | POA: Diagnosis present

## 2015-04-25 DIAGNOSIS — Z3482 Encounter for supervision of other normal pregnancy, second trimester: Secondary | ICD-10-CM

## 2015-04-29 ENCOUNTER — Emergency Department (HOSPITAL_COMMUNITY)
Admission: EM | Admit: 2015-04-29 | Discharge: 2015-04-29 | Disposition: A | Discharge status: Home / Self Care | Payer: Medicaid Other | Attending: Emergency Medicine | Admitting: Emergency Medicine

## 2015-04-29 ENCOUNTER — Encounter (HOSPITAL_COMMUNITY): Payer: Self-pay | Admitting: Emergency Medicine

## 2015-04-29 DIAGNOSIS — Z91040 Latex allergy status: Secondary | ICD-10-CM | POA: Diagnosis not present

## 2015-04-29 DIAGNOSIS — G5602 Carpal tunnel syndrome, left upper limb: Secondary | ICD-10-CM | POA: Diagnosis not present

## 2015-04-29 DIAGNOSIS — Z79899 Other long term (current) drug therapy: Secondary | ICD-10-CM | POA: Diagnosis not present

## 2015-04-29 DIAGNOSIS — M545 Low back pain, unspecified: Secondary | ICD-10-CM

## 2015-04-29 DIAGNOSIS — O99012 Anemia complicating pregnancy, second trimester: Secondary | ICD-10-CM | POA: Insufficient documentation

## 2015-04-29 DIAGNOSIS — Z87440 Personal history of urinary (tract) infections: Secondary | ICD-10-CM | POA: Diagnosis not present

## 2015-04-29 DIAGNOSIS — O26892 Other specified pregnancy related conditions, second trimester: Secondary | ICD-10-CM

## 2015-04-29 DIAGNOSIS — O9989 Other specified diseases and conditions complicating pregnancy, childbirth and the puerperium: Secondary | ICD-10-CM | POA: Insufficient documentation

## 2015-04-29 DIAGNOSIS — R011 Cardiac murmur, unspecified: Secondary | ICD-10-CM | POA: Insufficient documentation

## 2015-04-29 DIAGNOSIS — Z3A18 18 weeks gestation of pregnancy: Secondary | ICD-10-CM | POA: Diagnosis not present

## 2015-04-29 DIAGNOSIS — Z8742 Personal history of other diseases of the female genital tract: Secondary | ICD-10-CM | POA: Insufficient documentation

## 2015-04-29 DIAGNOSIS — O99352 Diseases of the nervous system complicating pregnancy, second trimester: Secondary | ICD-10-CM | POA: Diagnosis not present

## 2015-04-29 MED ORDER — HYDROCODONE-ACETAMINOPHEN 5-325 MG PO TABS: 0.5000 | ORAL_TABLET | Freq: Four times a day (QID) | ORAL | Status: DC | PRN

## 2015-04-29 NOTE — Discharge Instructions (Signed)
[facility] [address] in [place], [place]  E-Mail [email or web address] Office: [phone] [address] [place], [place] [address] Phone: [phone] Fax: [phone]   Back Pain in Pregnancy Back pain during pregnancy is common. It happens in about half of all pregnancies. It is important for you and your baby that you remain active during your pregnancy.If you feel that back pain is not allowing you to remain active or sleep well, it is time to see your caregiver. Back pain may be caused by several factors related to changes during your pregnancy.Fortunately, unless you had trouble with your back before your pregnancy, the pain is likely to get better after you deliver. Low back pain usually occurs between the fifth and seventh months of pregnancy. It can, however, happen in the first couple months. Factors that increase the risk of back problems include:   Previous back problems.  Injury to your back.  Having twins or multiple births.  A chronic cough.  Stress.  Job-related repetitive motions.  Muscle or spinal disease in the back.  Family history of back problems, ruptured (herniated) discs, or osteoporosis.  Depression, anxiety, and panic attacks. CAUSES   When you are pregnant, your body produces a hormone called relaxin. This hormonemakes the ligaments connecting the low back and pubic bones more flexible. This flexibility allows the baby to be delivered more easily. When your ligaments are loose, your muscles need to work harder to support your back. Soreness in your back can come from tired muscles. Soreness can also come from back tissues that are irritated since they are receiving less support.  As the baby grows, it puts pressure on the nerves and blood vessels in your pelvis. This can cause back pain.  As the baby grows and gets heavier during pregnancy, the uterus pushes the stomach muscles forward and changes your  center of gravity. This makes your back muscles work harder to maintain good posture. SYMPTOMS  Lumbar pain during pregnancy Lumbar pain during pregnancy usually occurs at or above the waist in the center of the back. There may be pain and numbness that radiates into your leg or foot. This is similar to low back pain experienced by non-pregnant women. It usually increases with sitting for long periods of time, standing, or repetitive lifting. Tenderness may also be present in the muscles along your upper back. Posterior pelvic pain during pregnancy Pain in the back of the pelvis is more common than lumbar pain in pregnancy. It is a deep pain felt in your side at the waistline, or across the tailbone (sacrum), or in both places. You may have pain on one or both sides. This pain can also go into the buttocks and backs of the upper thighs. Pubic and groin pain may also be present. The pain does not quickly resolve with rest, and morning stiffness may also be present. Pelvic pain during pregnancy can be brought on by most activities. A high level of fitness before and during pregnancy may or may not prevent this problem. Labor pain is usually 1 to 2 minutes apart, lasts for about 1 minute, and involves a bearing down feeling or pressure in your pelvis. However, if you are at term with the pregnancy, constant low back pain can be the beginning of early labor, and you should be aware of this. DIAGNOSIS  X-rays of the back should not be done during the first 12 to 14 weeks of the pregnancy and only when absolutely necessary during the  rest of the pregnancy. MRIs do not give off radiation and are safe during pregnancy. MRIs also should only be done when absolutely necessary. HOME CARE INSTRUCTIONS  Exercise as directed by your caregiver. Exercise is the most effective way to prevent or manage back pain. If you have a back problem, it is especially important to avoid sports that require sudden body movements.  Swimming and walking are great activities.  Do not stand in one place for long periods of time.  Do not wear high heels.  Sit in chairs with good posture. Use a pillow on your lower back if necessary. Make sure your head rests over your shoulders and is not hanging forward.  Try sleeping on your side, preferably the left side, with a pillow or two between your legs. If you are sore after a night's rest, your bedmay betoo soft.Try placing a board between your mattress and box spring.  Listen to your body when lifting.If you are experiencing pain, ask for help or try bending yourknees more so you can use your leg muscles rather than your back muscles. Squat down when picking up something from the floor. Do not bend over.  Eat a healthy diet. Try to gain weight within your caregiver's recommendations.  Use heat or cold packs 3 to 4 times a day for 15 minutes to help with the pain.  Only take over-the-counter or prescription medicines for pain, discomfort, or fever as directed by your caregiver. Sudden (acute) back pain  Use bed rest for only the most extreme, acute episodes of back pain. Prolonged bed rest over 48 hours will aggravate your condition.  Ice is very effective for acute conditions.  Put ice in a plastic bag.  Place a towel between your skin and the bag.  Leave the ice on for 10 to 20 minutes every 2 hours, or as needed.  Using heat packs for 30 minutes prior to activities is also helpful. Continued back pain See your caregiver if you have continued problems. Your caregiver can help or refer you for appropriate physical therapy. With conditioning, most back problems can be avoided. Sometimes, a more serious issue may be the cause of back pain. You should be seen right away if new problems seem to be developing. Your caregiver may recommend:  A maternity girdle.  An elastic sling.  A back brace.  A massage therapist or acupuncture. SEEK MEDICAL CARE IF:   You  are not able to do most of your daily activities, even when taking the pain medicine you were given.  You need a referral to a physical therapist or chiropractor.  You want to try acupuncture. SEEK IMMEDIATE MEDICAL CARE IF:  You develop numbness, tingling, weakness, or problems with the use of your arms or legs.  You develop severe back pain that is no longer relieved with medicines.  You have a sudden change in bowel or bladder control.  You have increasing pain in other areas of the body.  You develop shortness of breath, dizziness, or fainting.  You develop nausea, vomiting, or sweating.  You have back pain which is similar to labor pains.  You have back pain along with your water breaking or vaginal bleeding.  You have back pain or numbness that travels down your leg.  Your back pain developed after you fell.  You develop pain on one side of your back. You may have a kidney stone.  You see blood in your urine. You may have a bladder infection or  kidney stone.  You have back pain with blisters. You may have shingles. Back pain is fairly common during pregnancy but should not be accepted as just part of the process. Back pain should always be treated as soon as possible. This will make your pregnancy as pleasant as possible. Document Released: 11/25/2005 Document Revised: 11/09/2011 Document Reviewed: 01/06/2011 [facility] Patient Information 2015 [place], [place]. This information is not intended to replace advice given to you by your health care provider. Make sure you discuss any questions you have with your health care provider.  Carpal Tunnel Syndrome The carpal tunnel is a narrow area located on the palm side of your wrist. The tunnel is formed by the wrist bones and ligaments. Nerves, blood vessels, and tendons pass through the carpal tunnel. Repeated wrist motion or certain diseases may cause swelling within the tunnel. This swelling pinches the main nerve in the wrist  (median nerve) and causes the painful hand and arm condition called carpal tunnel syndrome. CAUSES   Repeated wrist motions.  Wrist injuries.  Certain diseases like arthritis, diabetes, alcoholism, hyperthyroidism, and kidney failure.  Obesity.  Pregnancy. SYMPTOMS   A "pins and needles" feeling in your fingers or hand, especially in your thumb, index and middle fingers.  Tingling or numbness in your fingers or hand.  An aching feeling in your entire arm, especially when your wrist and elbow are bent for long periods of time.  Wrist pain that goes up your arm to your shoulder.  Pain that goes down into your palm or fingers.  A weak feeling in your hands. DIAGNOSIS  Your health care provider will take your history and perform a physical exam. An electromyography test may be needed. This test measures electrical signals sent out by your nerves into the muscles. The electrical signals are usually slowed by carpal tunnel syndrome. You may also need X-rays. TREATMENT  Carpal tunnel syndrome may clear up by itself. Your health care provider may recommend a wrist splint or medicine such as a nonsteroidal anti-inflammatory medicine. Cortisone injections may help. Sometimes, surgery may be needed to free the pinched nerve.  HOME CARE INSTRUCTIONS   Take all medicine as directed by your health care provider. Only take over-the-counter or prescription medicines for pain, discomfort, or fever as directed by your health care provider.  If you were given a splint to keep your wrist from bending, wear it as directed. It is important to wear the splint at night. Wear the splint for as long as you have pain or numbness in your hand, arm, or wrist. This may take 1 to 2 months.  Rest your wrist from any activity that may be causing your pain. If your symptoms are work-related, you may need to talk to your employer about changing to a job that does not require using your wrist.  Put ice on your  wrist after long periods of wrist activity.  Put ice in a plastic bag.  Place a towel between your skin and the bag.  Leave the ice on for 15-20 minutes, 03-04 times a day.  Keep all follow-up visits as directed by your health care provider. This includes any orthopedic referrals, physical therapy, and rehabilitation. Any delay in getting necessary care could result in a delay or failure of your condition to heal. SEEK IMMEDIATE MEDICAL CARE IF:   You have new, unexplained symptoms.  Your symptoms get worse and are not helped or controlled with medicines. MAKE SURE YOU:   Understand these instructions.  Will watch your condition.  Will get help right away if you are not doing well or get worse. Document Released: 08/14/2000 Document Revised: 01/01/2014 Document Reviewed: 07/03/2011 [facility] Patient Information 2015 [place], [place]. This information is not intended to replace advice given to you by your health care provider. Make sure you discuss any questions you have with your health care provider.
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
## 2015-04-29 NOTE — ED Notes (Signed)
Pt. Left with all belongings and refused wheelchair. Discharge instructions were reviewed and all questions were answered.  

## 2015-05-16 ENCOUNTER — Encounter: Payer: Medicaid Other | Admitting: Obstetrics & Gynecology

## 2015-05-20 ENCOUNTER — Encounter: Payer: Self-pay | Admitting: Family Medicine

## 2015-05-20 ENCOUNTER — Encounter: Payer: Medicaid Other | Admitting: Family Medicine

## 2015-05-27 ENCOUNTER — Encounter: Payer: Medicaid Other | Admitting: Family Medicine

## 2015-05-27 LAB — POCT URINALYSIS DIP (DEVICE)
Bilirubin Urine: NEGATIVE
GLUCOSE, UA: NEGATIVE mg/dL
Ketones, ur: NEGATIVE mg/dL
Leukocytes, UA: NEGATIVE
Nitrite: NEGATIVE
PH: 8.5 — AB (ref 5.0–8.0)
Protein, ur: NEGATIVE mg/dL
Specific Gravity, Urine: 1.015 (ref 1.005–1.030)
UROBILINOGEN UA: 0.2 mg/dL (ref 0.0–1.0)

## 2015-06-04 ENCOUNTER — Ambulatory Visit (INDEPENDENT_AMBULATORY_CARE_PROVIDER_SITE_OTHER): Payer: Medicaid Other | Admitting: Certified Nurse Midwife

## 2015-06-04 VITALS — BP 109/56 | HR 82 | Temp 98.5°F | Wt 151.3 lb

## 2015-06-04 DIAGNOSIS — Z3492 Encounter for supervision of normal pregnancy, unspecified, second trimester: Secondary | ICD-10-CM

## 2015-06-04 DIAGNOSIS — Z3482 Encounter for supervision of other normal pregnancy, second trimester: Secondary | ICD-10-CM

## 2015-06-04 LAB — POCT URINALYSIS DIP (DEVICE)
Bilirubin Urine: NEGATIVE
Glucose, UA: NEGATIVE mg/dL
KETONES UR: NEGATIVE mg/dL
Nitrite: NEGATIVE
PROTEIN: NEGATIVE mg/dL
SPECIFIC GRAVITY, URINE: 1.02 (ref 1.005–1.030)
Urobilinogen, UA: 0.2 mg/dL (ref 0.0–1.0)
pH: 7 (ref 5.0–8.0)

## 2015-06-04 NOTE — Progress Notes (Signed)
Reviewed tip of week with patient Patient unsure about BTL at the moment, wants to wait till later date; handouts given on mirena & nexplanon

## 2015-06-04 NOTE — Patient Instructions (Signed)
Glucose Tolerance Test This is a test to see how your body processes carbohydrates. This test is often done to check patients for diabetes or the possibility of developing it. PREPARATION FOR TEST You should have nothing to eat or drink 12 hours before the test. You will be given a form of sugar (glucose) and then blood samples will be drawn from your vein to determine the level of sugar in your blood. Alternatively, blood may be drawn from your finger for testing. You should not smoke or exercise during the test. NORMAL FINDINGS  Fasting: 70-115 mg/dL  30 minutes: less than 200 mg/dL  1 hour: less than 200 mg/dL  2 hours: less than 140 mg/dL  3 hours: 70-115 mg/dL  4 hours: 70-115 mg/dL Ranges for normal findings may vary among different laboratories and hospitals. You should always check with your doctor after having lab work or other tests done to discuss the meaning of your test results and whether your values are considered within normal limits. MEANING OF TEST Your caregiver will go over the test results with you and discuss the importance and meaning of your results, as well as treatment options and the need for additional tests. OBTAINING THE TEST RESULTS It is your responsibility to obtain your test results. Ask the lab or department performing the test when and how you will get your results. Document Released: 09/09/2004 Document Revised: 11/09/2011 Document Reviewed: 12/22/2013 [facility] Patient Information 2015 [place], [place]. This information is not intended to replace advice given to you by your health care provider. Make sure you discuss any questions you have with your health care provider.

## 2015-06-04 NOTE — Progress Notes (Signed)
Subjective:  Katherine Cooley is a 34 y.o. G3P1011 at [redacted]w[redacted]d being seen today for ongoing prenatal care.  Patient reports no complaints.  Contractions: Not present.  Vag. Bleeding: None. Movement: Present. Denies leaking of fluid.   The following portions of the patient's history were reviewed and updated as appropriate: allergies, current medications, past family history, past medical history, past social history, past surgical history and problem list.   Objective:   Filed Vitals:   06/04/15 0905  BP: 109/56  Pulse: 82  Temp: 98.5 F (36.9 C)  Weight: 151 lb 4.8 oz (68.629 kg)    Fetal Status: Fetal Heart Rate (bpm): 157   Movement: Present     General:  Alert, oriented and cooperative. Patient is in no acute distress.  Skin: Skin is warm and dry. No rash noted.   Cardiovascular: Normal heart rate noted  Respiratory: Normal respiratory effort, no problems with respiration noted  Abdomen: Soft, gravid, appropriate for gestational age. Pain/Pressure: Absent     Pelvic: Vag. Bleeding: None     Cervical exam deferred        Extremities: Normal range of motion.  Edema: None  Mental Status: Normal mood and affect. Normal behavior. Normal judgment and thought content.   Urinalysis: Urine Protein: Negative Urine Glucose: Negative  Assessment and Plan:  Pregnancy: G3P1011 at [redacted]w[redacted]d  There are no diagnoses linked to this encounter. Preterm labor symptoms and general obstetric precautions including but not limited to vaginal bleeding, contractions, leaking of fluid and fetal movement were reviewed in detail with the patient. Please refer to After Visit Summary for other counseling recommendations.  Return in about 4 weeks (around 07/02/2015) for 1 hour gtt next visit.   Larey Days, CNM

## 2015-07-02 ENCOUNTER — Ambulatory Visit (INDEPENDENT_AMBULATORY_CARE_PROVIDER_SITE_OTHER): Payer: Medicaid Other | Admitting: Obstetrics and Gynecology

## 2015-07-02 ENCOUNTER — Other Ambulatory Visit: Payer: Self-pay | Admitting: Obstetrics and Gynecology

## 2015-07-02 VITALS — BP 112/63 | HR 79 | Temp 98.6°F | Wt 159.5 lb

## 2015-07-02 DIAGNOSIS — Z23 Encounter for immunization: Secondary | ICD-10-CM | POA: Diagnosis not present

## 2015-07-02 DIAGNOSIS — Z3480 Encounter for supervision of other normal pregnancy, unspecified trimester: Secondary | ICD-10-CM

## 2015-07-02 DIAGNOSIS — Z3482 Encounter for supervision of other normal pregnancy, second trimester: Secondary | ICD-10-CM

## 2015-07-02 LAB — POCT URINALYSIS DIP (DEVICE)
Bilirubin Urine: NEGATIVE
Glucose, UA: NEGATIVE mg/dL
Ketones, ur: NEGATIVE mg/dL
Nitrite: NEGATIVE
PH: 7 (ref 5.0–8.0)
PROTEIN: NEGATIVE mg/dL
SPECIFIC GRAVITY, URINE: 1.02 (ref 1.005–1.030)
Urobilinogen, UA: 0.2 mg/dL (ref 0.0–1.0)

## 2015-07-02 LAB — CBC
HCT: 26.4 % — ABNORMAL LOW (ref 36.0–46.0)
Hemoglobin: 9.2 g/dL — ABNORMAL LOW (ref 12.0–15.0)
MCH: 31.1 pg (ref 26.0–34.0)
MCHC: 34.8 g/dL (ref 30.0–36.0)
MCV: 89.2 fL (ref 78.0–100.0)
MPV: 8.3 fL — ABNORMAL LOW (ref 8.6–12.4)
PLATELETS: 274 10*3/uL (ref 150–400)
RBC: 2.96 MIL/uL — ABNORMAL LOW (ref 3.87–5.11)
RDW: 13.4 % (ref 11.5–15.5)
WBC: 8.8 10*3/uL (ref 4.0–10.5)

## 2015-07-02 LAB — RPR

## 2015-07-02 MED ORDER — TETANUS-DIPHTH-ACELL PERTUSSIS 5-2.5-18.5 LF-MCG/0.5 IM SUSP
0.5000 mL | Freq: Once | INTRAMUSCULAR | Status: AC
  Administered 2015-07-02: 0.5 mL via INTRAMUSCULAR

## 2015-07-02 NOTE — Progress Notes (Signed)
Patient reports pelvic pressure and pain from carpal tunnel Reviewed tip of week with patient

## 2015-07-02 NOTE — Progress Notes (Signed)
Subjective:  Katherine Cooley is a 34 y.o. G3P1011 at [redacted]w[redacted]d being seen today for ongoing prenatal care.  Patient reports no complaints.  Contractions: Not present.  Vag. Bleeding: None. Movement: Present. Denies leaking of fluid.   The following portions of the patient's history were reviewed and updated as appropriate: allergies, current medications, past family history, past medical history, past social history, past surgical history and problem list. Problem list updated.  Objective:   Filed Vitals:   07/02/15 0830  BP: 112/63  Pulse: 79  Temp: 98.6 F (37 C)  Weight: 159 lb 8 oz (72.349 kg)    Fetal Status: Fetal Heart Rate (bpm): 155   Movement: Present     General:  Alert, oriented and cooperative. Patient is in no acute distress.  Skin: Skin is warm and dry. No rash noted.   Cardiovascular: Normal heart rate noted  Respiratory: Normal respiratory effort, no problems with respiration noted  Abdomen: Soft, gravid, appropriate for gestational age. Pain/Pressure: Present     Pelvic: Vag. Bleeding: None     Cervical exam deferred        Extremities: Normal range of motion.  Edema: Trace  Mental Status: Normal mood and affect. Normal behavior. Normal judgment and thought content.   Urinalysis: Urine Protein: Trace Urine Glucose: Negative  Assessment and Plan:  Pregnancy: G3P1011 at [redacted]w[redacted]d  1. Supervision of normal pregnancy, antepartum, unspecified trimester - Glucose Tolerance, 1 HR (50g) w/o Fasting - CBC - RPR - HIV antibody (with reflex) - Tdap (BOOSTRIX) injection 0.5 mL; Inject 0.5 mLs into the muscle once. - Flu Vaccine QUAD 36+ mos IM; Standing - Flu Vaccine QUAD 36+ mos IM - scheduling c/s today (patient counseled in risks and benefits of TOLAC but adamant she wants repeat c/s) - LARC discussion today  Preterm labor symptoms and general obstetric precautions including but not limited to vaginal bleeding, contractions, leaking of fluid and fetal movement were  reviewed in detail with the patient. Please refer to After Visit Summary for other counseling recommendations.  Return in about 2 weeks (around 07/16/2015).   Gwynne Edinger, MD

## 2015-07-03 ENCOUNTER — Telehealth: Payer: Self-pay | Admitting: *Deleted

## 2015-07-03 ENCOUNTER — Encounter (HOSPITAL_COMMUNITY): Payer: Self-pay | Admitting: *Deleted

## 2015-07-03 ENCOUNTER — Encounter: Payer: Self-pay | Admitting: *Deleted

## 2015-07-03 ENCOUNTER — Telehealth: Payer: Self-pay | Admitting: Obstetrics and Gynecology

## 2015-07-03 DIAGNOSIS — D509 Iron deficiency anemia, unspecified: Secondary | ICD-10-CM | POA: Insufficient documentation

## 2015-07-03 DIAGNOSIS — O99019 Anemia complicating pregnancy, unspecified trimester: Principal | ICD-10-CM

## 2015-07-03 HISTORY — DX: Iron deficiency anemia, unspecified: D50.9

## 2015-07-03 LAB — FERRITIN: Ferritin: 10 ng/mL (ref 10–291)

## 2015-07-03 LAB — HIV ANTIBODY (ROUTINE TESTING W REFLEX): HIV 1&2 Ab, 4th Generation: NONREACTIVE

## 2015-07-03 LAB — GLUCOSE TOLERANCE, 1 HOUR (50G) W/O FASTING: GLUCOSE 1 HOUR GTT: 110 mg/dL (ref 70–140)

## 2015-07-03 MED ORDER — FERROUS SULFATE 325 (65 FE) MG PO TABS: 325.0000 mg | ORAL_TABLET | Freq: Two times a day (BID) | ORAL | Status: DC

## 2015-07-03 NOTE — Telephone Encounter (Signed)
Anemia, ferritin low, starting oral iron, nursing to inform.

## 2015-07-03 NOTE — Addendum Note (Signed)
Addended by: Laurey Arrow B on: 07/03/2015 08:12 AM   Modules accepted: Orders

## 2015-07-03 NOTE — Telephone Encounter (Signed)
Pt left message with questions about her scheduled C/S. She was told the date of 09/19/15 and wants to know if this will be the date of surgery or for labs. I called pt and informed her of C/S scheduled on 09/19/15. She requests this date to be changed and I advised her to discuss at her next visit.  Pt voiced understanding.

## 2015-07-12 ENCOUNTER — Other Ambulatory Visit: Payer: Self-pay | Admitting: Obstetrics & Gynecology

## 2015-07-16 ENCOUNTER — Ambulatory Visit (INDEPENDENT_AMBULATORY_CARE_PROVIDER_SITE_OTHER): Payer: Medicaid Other | Admitting: Advanced Practice Midwife

## 2015-07-16 ENCOUNTER — Encounter: Payer: Self-pay | Admitting: Advanced Practice Midwife

## 2015-07-16 VITALS — BP 125/61 | HR 82 | Temp 98.8°F | Wt 161.9 lb

## 2015-07-16 DIAGNOSIS — D509 Iron deficiency anemia, unspecified: Secondary | ICD-10-CM

## 2015-07-16 DIAGNOSIS — O99013 Anemia complicating pregnancy, third trimester: Secondary | ICD-10-CM

## 2015-07-16 DIAGNOSIS — O99019 Anemia complicating pregnancy, unspecified trimester: Principal | ICD-10-CM

## 2015-07-16 LAB — POCT URINALYSIS DIP (DEVICE)
Bilirubin Urine: NEGATIVE
Glucose, UA: NEGATIVE mg/dL
Ketones, ur: NEGATIVE mg/dL
Leukocytes, UA: NEGATIVE
NITRITE: NEGATIVE
PH: 7 (ref 5.0–8.0)
Protein, ur: NEGATIVE mg/dL
Specific Gravity, Urine: 1.025 (ref 1.005–1.030)
Urobilinogen, UA: 0.2 mg/dL (ref 0.0–1.0)

## 2015-07-16 NOTE — Patient Instructions (Signed)

## 2015-07-16 NOTE — Progress Notes (Signed)
Subjective:  Katherine Cooley is a 34 y.o. G3P1011 at [redacted]w[redacted]d being seen today for ongoing prenatal care.  Patient reports Left sciatic pain at work, and she wants to move her C/S date. The date she was assigne, is one where someone she knows died..  Contractions: Not present.  Vag. Bleeding: None. Movement: Present. Denies leaking of fluid.   The following portions of the patient's history were reviewed and updated as appropriate: allergies, current medications, past family history, past medical history, past social history, past surgical history and problem list. Problem list updated.  Objective:   Filed Vitals:   07/16/15 1110  BP: 125/61  Pulse: 82  Temp: 98.8 F (37.1 C)  Weight: 161 lb 14.4 oz (73.437 kg)    Fetal Status: Fetal Heart Rate (bpm): 147   Movement: Present     General:  Alert, oriented and cooperative. Patient is in no acute distress.  Skin: Skin is warm and dry. No rash noted.   Cardiovascular: Normal heart rate noted  Respiratory: Normal respiratory effort, no problems with respiration noted  Abdomen: Soft, gravid, appropriate for gestational age. Pain/Pressure: Present     Pelvic: Vag. Bleeding: None     Cervical exam deferred        Extremities: Normal range of motion.  Edema: Trace  Mental Status: Normal mood and affect. Normal behavior. Normal judgment and thought content.   Urinalysis: Urine Protein: Negative Urine Glucose: Negative  Assessment and Plan:  Pregnancy: G3P1011 at [redacted]w[redacted]d Sciatic pain:   Advised to put insole in that side's shoe  Preterm labor symptoms and general obstetric precautions including but not limited to vaginal bleeding, contractions, leaking of fluid and fetal movement were reviewed in detail with the patient. Please refer to After Visit Summary for other counseling recommendations.  RTC 2 weeks  Seabron Spates, CNM

## 2015-07-30 ENCOUNTER — Ambulatory Visit (INDEPENDENT_AMBULATORY_CARE_PROVIDER_SITE_OTHER): Payer: Medicaid Other | Admitting: Student

## 2015-07-30 VITALS — BP 105/62 | HR 87 | Temp 98.2°F | Wt 161.0 lb

## 2015-07-30 DIAGNOSIS — O99013 Anemia complicating pregnancy, third trimester: Secondary | ICD-10-CM

## 2015-07-30 DIAGNOSIS — Z3483 Encounter for supervision of other normal pregnancy, third trimester: Secondary | ICD-10-CM

## 2015-07-30 DIAGNOSIS — O9989 Other specified diseases and conditions complicating pregnancy, childbirth and the puerperium: Secondary | ICD-10-CM | POA: Diagnosis not present

## 2015-07-30 DIAGNOSIS — N898 Other specified noninflammatory disorders of vagina: Secondary | ICD-10-CM | POA: Diagnosis not present

## 2015-07-30 DIAGNOSIS — O26893 Other specified pregnancy related conditions, third trimester: Secondary | ICD-10-CM

## 2015-07-30 DIAGNOSIS — D509 Iron deficiency anemia, unspecified: Secondary | ICD-10-CM

## 2015-07-30 LAB — POCT URINALYSIS DIP (DEVICE)
Bilirubin Urine: NEGATIVE
Glucose, UA: NEGATIVE mg/dL
Ketones, ur: NEGATIVE mg/dL
Nitrite: NEGATIVE
Protein, ur: NEGATIVE mg/dL
Specific Gravity, Urine: 1.02 (ref 1.005–1.030)
UROBILINOGEN UA: 1 mg/dL (ref 0.0–1.0)
pH: 8.5 — ABNORMAL HIGH (ref 5.0–8.0)

## 2015-07-30 LAB — WET PREP, GENITAL
Trich, Wet Prep: NONE SEEN
Yeast Wet Prep HPF POC: NONE SEEN

## 2015-07-30 NOTE — Patient Instructions (Signed)
Fetal Movement Counts Patient Name: __________________________________________________ Patient Due Date: ____________________ Performing a fetal movement count is highly recommended in high-risk pregnancies, but it is good for every pregnant woman to do. Your health care provider may ask you to start counting fetal movements at 28 weeks of the pregnancy. Fetal movements often increase:  After eating a full meal.  After physical activity.  After eating or drinking something sweet or cold.  At rest. Pay attention to when you feel the baby is most active. This will help you notice a pattern of your baby's sleep and wake cycles and what factors contribute to an increase in fetal movement. It is important to perform a fetal movement count at the same time each day when your baby is normally most active.  HOW TO COUNT FETAL MOVEMENTS 1. Find a quiet and comfortable area to sit or lie down on your left side. Lying on your left side provides the best blood and oxygen circulation to your baby. 2. Write down the day and time on a sheet of paper or in a journal. 3. Start counting kicks, flutters, swishes, rolls, or jabs in a 2-hour period. You should feel at least 10 movements within 2 hours. 4. If you do not feel 10 movements in 2 hours, wait 2-3 hours and count again. Look for a change in the pattern or not enough counts in 2 hours. SEEK MEDICAL CARE IF:  You feel less than 10 counts in 2 hours, tried twice.  There is no movement in over an hour.  The pattern is changing or taking longer each day to reach 10 counts in 2 hours.  You feel the baby is not moving as he or she usually does. Date: ____________ Movements: ____________ Start time: ____________ Katherine Cooley time: ____________  Date: ____________ Movements: ____________ Start time: ____________ Katherine Cooley time: ____________ Date: ____________ Movements: ____________ Start time: ____________ Katherine Cooley time: ____________ Date: ____________ Movements:  ____________ Start time: ____________ Katherine Cooley time: ____________ Date: ____________ Movements: ____________ Start time: ____________ Katherine Cooley time: ____________ Date: ____________ Movements: ____________ Start time: ____________ Katherine Cooley time: ____________ Date: ____________ Movements: ____________ Start time: ____________ Katherine Cooley time: ____________ Date: ____________ Movements: ____________ Start time: ____________ Katherine Cooley time: ____________  Date: ____________ Movements: ____________ Start time: ____________ Katherine Cooley time: ____________ Date: ____________ Movements: ____________ Start time: ____________ Katherine Cooley time: ____________ Date: ____________ Movements: ____________ Start time: ____________ Katherine Cooley time: ____________ Date: ____________ Movements: ____________ Start time: ____________ Katherine Cooley time: ____________ Date: ____________ Movements: ____________ Start time: ____________ Katherine Cooley time: ____________ Date: ____________ Movements: ____________ Start time: ____________ Katherine Cooley time: ____________ Date: ____________ Movements: ____________ Start time: ____________ Katherine Cooley time: ____________  Date: ____________ Movements: ____________ Start time: ____________ Katherine Cooley time: ____________ Date: ____________ Movements: ____________ Start time: ____________ Katherine Cooley time: ____________ Date: ____________ Movements: ____________ Start time: ____________ Katherine Cooley time: ____________ Date: ____________ Movements: ____________ Start time: ____________ Katherine Cooley time: ____________ Date: ____________ Movements: ____________ Start time: ____________ Katherine Cooley time: ____________ Date: ____________ Movements: ____________ Start time: ____________ Katherine Cooley time: ____________ Date: ____________ Movements: ____________ Start time: ____________ Katherine Cooley time: ____________  Date: ____________ Movements: ____________ Start time: ____________ Katherine Cooley time: ____________ Date: ____________ Movements: ____________ Start time: ____________ Katherine Cooley  time: ____________ Date: ____________ Movements: ____________ Start time: ____________ Katherine Cooley time: ____________ Date: ____________ Movements: ____________ Start time: ____________ Katherine Cooley time: ____________ Date: ____________ Movements: ____________ Start time: ____________ Katherine Cooley time: ____________ Date: ____________ Movements: ____________ Start time: ____________ Katherine Cooley time: ____________ Date: ____________ Movements: ____________ Start time: ____________ Katherine Cooley time: ____________  Date: ____________ Movements: ____________ Start time: ____________ Katherine Cooley  time: ____________ Date: ____________ Movements: ____________ Start time: ____________ Katherine Cooley time: ____________ Date: ____________ Movements: ____________ Start time: ____________ Katherine Cooley time: ____________ Date: ____________ Movements: ____________ Start time: ____________ Katherine Cooley time: ____________ Date: ____________ Movements: ____________ Start time: ____________ Katherine Cooley time: ____________ Date: ____________ Movements: ____________ Start time: ____________ Katherine Cooley time: ____________ Date: ____________ Movements: ____________ Start time: ____________ Katherine Cooley time: ____________  Date: ____________ Movements: ____________ Start time: ____________ Katherine Cooley time: ____________ Date: ____________ Movements: ____________ Start time: ____________ Katherine Cooley time: ____________ Date: ____________ Movements: ____________ Start time: ____________ Katherine Cooley time: ____________ Date: ____________ Movements: ____________ Start time: ____________ Katherine Cooley time: ____________ Date: ____________ Movements: ____________ Start time: ____________ Katherine Cooley time: ____________ Date: ____________ Movements: ____________ Start time: ____________ Katherine Cooley time: ____________ Date: ____________ Movements: ____________ Start time: ____________ Katherine Cooley time: ____________  Date: ____________ Movements: ____________ Start time: ____________ Katherine Cooley time: ____________ Date: ____________  Movements: ____________ Start time: ____________ Katherine Cooley time: ____________ Date: ____________ Movements: ____________ Start time: ____________ Katherine Cooley time: ____________ Date: ____________ Movements: ____________ Start time: ____________ Katherine Cooley time: ____________ Date: ____________ Movements: ____________ Start time: ____________ Katherine Cooley time: ____________ Date: ____________ Movements: ____________ Start time: ____________ Katherine Cooley time: ____________ Date: ____________ Movements: ____________ Start time: ____________ Katherine Cooley time: ____________  Date: ____________ Movements: ____________ Start time: ____________ Katherine Cooley time: ____________ Date: ____________ Movements: ____________ Start time: ____________ Katherine Cooley time: ____________ Date: ____________ Movements: ____________ Start time: ____________ Katherine Cooley time: ____________ Date: ____________ Movements: ____________ Start time: ____________ Katherine Cooley time: ____________ Date: ____________ Movements: ____________ Start time: ____________ Katherine Cooley time: ____________ Date: ____________ Movements: ____________ Start time: ____________ Katherine Cooley time: ____________   This information is not intended to replace advice given to you by your health care provider. Make sure you discuss any questions you have with your health care provider.   Document Released: 09/16/2006 Document Revised: 09/07/2014 Document Reviewed: 06/13/2012 Elsevier Interactive Patient Education 2016 [employer]. Preterm Labor Information Preterm labor is when labor starts at less than 37 weeks of pregnancy. The normal length of a pregnancy is 39 to 41 weeks. CAUSES Often, there is no identifiable underlying cause as to why a woman goes into preterm labor. One of the most common known causes of preterm labor is infection. Infections of the uterus, cervix, vagina, amniotic sac, bladder, kidney, or even the lungs (pneumonia) can cause labor to start. Other suspected causes of preterm labor include:    Urogenital infections, such as yeast infections and bacterial vaginosis.   Uterine abnormalities (uterine shape, uterine septum, fibroids, or bleeding from the placenta).   A cervix that has been operated on (it may fail to stay closed).   Malformations in the fetus.   Multiple gestations (twins, triplets, and so on).   Breakage of the amniotic sac.  RISK FACTORS 5. Having a previous history of preterm labor.  6. Having premature rupture of membranes (PROM).  7. Having a placenta that covers the opening of the cervix (placenta previa).  8. Having a placenta that separates from the uterus (placental abruption).  9. Having a cervix that is too weak to hold the fetus in the uterus (incompetent cervix).  10. Having too much fluid in the amniotic sac (polyhydramnios).  11. Taking illegal drugs or smoking while pregnant.  12. Not gaining enough weight while pregnant.  36. Being younger than 33 and older than 33 years old.  14. Having a low socioeconomic status.  47. Being African American. SYMPTOMS Signs and symptoms of preterm labor include:   Menstrual-like cramps, abdominal pain, or back pain.  Uterine contractions that are regular, as  frequent as six in an hour, regardless of their intensity (may be mild or painful).  Contractions that start on the top of the uterus and spread down to the lower abdomen and back.   A sense of increased pelvic pressure.   A watery or bloody mucus discharge that comes from the vagina.  TREATMENT Depending on the length of the pregnancy and other circumstances, your health care provider may suggest bed rest. If necessary, there are medicines that can be given to stop contractions and to mature the fetal lungs. If labor happens before 34 weeks of pregnancy, a prolonged hospital stay may be recommended. Treatment depends on the condition of both you and the fetus.  WHAT SHOULD YOU DO IF YOU THINK YOU ARE IN PRETERM LABOR? Call  your health care provider right away. You will need to go to the hospital to get checked immediately. HOW CAN YOU PREVENT PRETERM LABOR IN FUTURE PREGNANCIES? You should:   Stop smoking if you smoke.  Maintain healthy weight gain and avoid chemicals and drugs that are not necessary.  Be watchful for any type of infection.  Inform your health care provider if you have a known history of preterm labor.   This information is not intended to replace advice given to you by your health care provider. Make sure you discuss any questions you have with your health care provider.   Document Released: 11/07/2003 Document Revised: 04/19/2013 Document Reviewed: 09/19/2012 Elsevier Interactive Patient Education [employer].

## 2015-07-30 NOTE — Progress Notes (Signed)
Pain/pressure- lowe abd   Edema- feet  Educated pt on Skin to Skin
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
## 2015-07-31 ENCOUNTER — Other Ambulatory Visit: Payer: Self-pay | Admitting: Student

## 2015-07-31 ENCOUNTER — Telehealth: Payer: Self-pay | Admitting: General Practice

## 2015-07-31 DIAGNOSIS — B9689 Other specified bacterial agents as the cause of diseases classified elsewhere: Secondary | ICD-10-CM

## 2015-07-31 DIAGNOSIS — N760 Acute vaginitis: Principal | ICD-10-CM

## 2015-07-31 MED ORDER — METRONIDAZOLE 500 MG PO TABS: 500.0000 mg | ORAL_TABLET | Freq: Two times a day (BID) | ORAL | Status: DC

## 2015-07-31 NOTE — Telephone Encounter (Signed)
Patient called and left message stating she got a call from pharmacy that a medication was ready and she wants to know what this is for. Called patient, no answer- left message stating we are trying to reach you to return your phone call, please call us back at the clinics

## 2015-07-31 NOTE — Telephone Encounter (Signed)
Patient called and left message stating she is returning our call. Called patient and informed her of BV and med sent to pharmacy. Patient verbalized understanding & had no questions

## 2015-08-13 ENCOUNTER — Ambulatory Visit (INDEPENDENT_AMBULATORY_CARE_PROVIDER_SITE_OTHER): Payer: Medicaid Other | Admitting: Certified Nurse Midwife

## 2015-08-13 VITALS — BP 113/63 | HR 82 | Temp 99.0°F | Wt 162.0 lb

## 2015-08-13 DIAGNOSIS — Z3483 Encounter for supervision of other normal pregnancy, third trimester: Secondary | ICD-10-CM | POA: Diagnosis present

## 2015-08-13 LAB — POCT URINALYSIS DIP (DEVICE)
Bilirubin Urine: NEGATIVE
Glucose, UA: NEGATIVE mg/dL
Ketones, ur: NEGATIVE mg/dL
Nitrite: NEGATIVE
Protein, ur: 30 mg/dL — AB
Specific Gravity, Urine: 1.02 (ref 1.005–1.030)
Urobilinogen, UA: 0.2 mg/dL (ref 0.0–1.0)
pH: 7 (ref 5.0–8.0)
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
## 2015-08-13 NOTE — Patient Instructions (Signed)

## 2015-08-13 NOTE — Progress Notes (Signed)
Pain- lower back and lower abd  Educated pt on [place]

## 2015-08-14 ENCOUNTER — Telehealth: Payer: Self-pay | Admitting: General Practice

## 2015-08-14 NOTE — Telephone Encounter (Signed)
Patient called and left message stating she called a dentist but they said they couldn't help her so she is calling here. Patient states something is growing over her teeth like her gums and would like a call back with options

## 2015-08-15 ENCOUNTER — Telehealth: Payer: Self-pay | Admitting: General Practice

## 2015-08-15 NOTE — Telephone Encounter (Signed)
Patient called into front office stating the pain in her gums and face is increasing. Patient states her jaw hurts now & is swollen. Patient states it also hurts to the point where she cannot eat. Recommended to patient to go to urgent care for evaluation for possible infection. Patient verbalized understanding & had no questions

## 2015-08-15 NOTE — Telephone Encounter (Signed)
Spoke with patient concerning gums being swollen. Pt stated this is really painful and would like a referral to dental clinic. She is currently 8 months pregnant. I will go ahead and refer patient to the dental clinic.

## 2015-08-19 ENCOUNTER — Emergency Department (INDEPENDENT_AMBULATORY_CARE_PROVIDER_SITE_OTHER)
Admission: EM | Admit: 2015-08-19 | Discharge: 2015-08-19 | Disposition: A | Discharge status: Home / Self Care | Payer: Medicaid Other | Source: Home / Self Care | Attending: Family Medicine | Admitting: Family Medicine

## 2015-08-19 ENCOUNTER — Encounter (HOSPITAL_COMMUNITY): Payer: Self-pay | Admitting: *Deleted

## 2015-08-19 DIAGNOSIS — K056 Periodontal disease, unspecified: Secondary | ICD-10-CM

## 2015-08-19 DIAGNOSIS — K090 Developmental odontogenic cysts: Secondary | ICD-10-CM

## 2015-08-19 MED ORDER — AMOXICILLIN 500 MG PO CAPS: 500.0000 mg | ORAL_CAPSULE | Freq: Three times a day (TID) | ORAL | Status: DC
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
## 2015-08-19 NOTE — ED Notes (Addendum)
l  Side     Of    Gum  Swelling    Symptoms         And  Some  Draining       For  sev  Weeks     -   Unable       To   See        A   Dentist      Pt  Is  [redacted]  Weeks  pregnant

## 2015-08-19 NOTE — Discharge Instructions (Signed)
Take medicine as prescribed, see your dentist as soon as possible °

## 2015-08-27 ENCOUNTER — Other Ambulatory Visit (HOSPITAL_COMMUNITY)
Admission: RE | Admit: 2015-08-27 | Discharge: 2015-08-27 | Disposition: A | Discharge status: Home / Self Care | Payer: Medicaid Other | Source: Ambulatory Visit | Attending: Certified Nurse Midwife | Admitting: Certified Nurse Midwife

## 2015-08-27 ENCOUNTER — Encounter: Payer: Self-pay | Admitting: Obstetrics & Gynecology

## 2015-08-27 ENCOUNTER — Ambulatory Visit (INDEPENDENT_AMBULATORY_CARE_PROVIDER_SITE_OTHER): Payer: Medicaid Other | Admitting: Certified Nurse Midwife

## 2015-08-27 VITALS — BP 117/72 | HR 83 | Temp 98.6°F | Wt 164.0 lb

## 2015-08-27 DIAGNOSIS — Z3483 Encounter for supervision of other normal pregnancy, third trimester: Secondary | ICD-10-CM

## 2015-08-27 DIAGNOSIS — Z113 Encounter for screening for infections with a predominantly sexual mode of transmission: Secondary | ICD-10-CM

## 2015-08-27 DIAGNOSIS — D509 Iron deficiency anemia, unspecified: Secondary | ICD-10-CM | POA: Diagnosis not present

## 2015-08-27 DIAGNOSIS — O99013 Anemia complicating pregnancy, third trimester: Secondary | ICD-10-CM | POA: Diagnosis present

## 2015-08-27 LAB — POCT URINALYSIS DIP (DEVICE)
Bilirubin Urine: NEGATIVE
Glucose, UA: NEGATIVE mg/dL
Ketones, ur: NEGATIVE mg/dL
Nitrite: NEGATIVE
Protein, ur: NEGATIVE mg/dL
Specific Gravity, Urine: 1.02 (ref 1.005–1.030)
Urobilinogen, UA: 0.2 mg/dL (ref 0.0–1.0)
pH: 7 (ref 5.0–8.0)

## 2015-08-27 NOTE — Patient Instructions (Signed)
Group B streptococcus (GBS) is a type of bacteria often found in healthy women. GBS is not the same as the bacteria that causes strep throat. You may have GBS in your vagina, rectum, or bladder. GBS does not spread through sexual contact, but it can be passed to a baby during childbirth. This can be dangerous for your baby. It is not dangerous to you and usually does not cause any symptoms. Your health care provider may test you for GBS when your pregnancy is between 35 and 37 weeks. GBS is dangerous only during birth, so there is no need to test for it earlier. It is possible to have GBS during pregnancy and never pass it to your baby. If your test results are positive for GBS, your health care provider may recommend giving you antibiotic medicine during delivery to make sure your baby stays healthy. RISK FACTORS You are more likely to pass GBS to your baby if:   Your water breaks (ruptured membrane) or you go into labor before 37 weeks.  Your water breaks 18 hours before you deliver.  You passed GBS during a previous pregnancy.  You have a urinary tract infection caused by GBS any time during pregnancy.  You have a fever during labor. SYMPTOMS Most women who have GBS do not have any symptoms. If you have a urinary tract infection caused by GBS, you might have frequent or painful urination and fever. Babies who get GBS usually show symptoms within 7 days of birth. Symptoms may include:   Breathing problems.  Heart and blood pressure problems.  Digestive and kidney problems. DIAGNOSIS Routine screening for GBS is recommended for all pregnant women. A health care provider takes a sample of the fluid in your vagina and rectum with a swab. It is then sent to a lab to be checked for GBS. A sample of your urine may also be checked for the bacteria.  TREATMENT If you test positive for GBS, you may need treatment with an antibiotic medicine during labor. As soon as you go into labor, or as soon as  your membranes rupture, you will get the antibiotic medicine through an IV access. You will continue to get the medicine until after you give birth. You do not need antibiotic medicine if you are having a cesarean delivery.If your baby shows signs or symptoms of GBS after birth, your baby can also be treated with an antibiotic medicine. HOME CARE INSTRUCTIONS   Take all antibiotic medicine as prescribed by your health care provider. Only take medicine as directed.   Continue with prenatal visits and care.   Keep all follow-up appointments.  SEEK MEDICAL CARE IF:   You have pain when you urinate.   You have to urinate frequently.   You have a fever.  SEEK IMMEDIATE MEDICAL CARE IF:   Your membranes rupture.  You go into labor.   This information is not intended to replace advice given to you by your health care provider. Make sure you discuss any questions you have with your health care provider.   Document Released: 11/24/2007 Document Revised: 08/22/2013 Document Reviewed: 06/09/2013 Elsevier Interactive Patient Education 2016 [employer]vier Inc.  

## 2015-08-27 NOTE — Progress Notes (Signed)
Patient reports pelvic & back pain/pressure
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
## 2015-08-30 LAB — CULTURE, BETA STREP (GROUP B ONLY)

## 2015-08-31 LAB — GC/CHLAMYDIA PROBE AMP (~~LOC~~) NOT AT ARMC
Chlamydia: NEGATIVE
Neisseria Gonorrhea: NEGATIVE

## 2015-09-03 ENCOUNTER — Ambulatory Visit (INDEPENDENT_AMBULATORY_CARE_PROVIDER_SITE_OTHER): Payer: Medicaid Other | Admitting: Advanced Practice Midwife

## 2015-09-03 VITALS — BP 112/63 | HR 68 | Temp 98.9°F | Wt 167.2 lb

## 2015-09-03 DIAGNOSIS — Z3483 Encounter for supervision of other normal pregnancy, third trimester: Secondary | ICD-10-CM

## 2015-09-03 LAB — POCT URINALYSIS DIP (DEVICE)
Bilirubin Urine: NEGATIVE
GLUCOSE, UA: NEGATIVE mg/dL
Ketones, ur: NEGATIVE mg/dL
Leukocytes, UA: NEGATIVE
NITRITE: NEGATIVE
PROTEIN: NEGATIVE mg/dL
Specific Gravity, Urine: 1.02 (ref 1.005–1.030)
UROBILINOGEN UA: 0.2 mg/dL (ref 0.0–1.0)
pH: 6.5 (ref 5.0–8.0)

## 2015-09-03 NOTE — Progress Notes (Signed)
Pressure- lower abd  Educated pt on Benefits of Breastfeeding for [employer]
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
## 2015-09-10 ENCOUNTER — Ambulatory Visit (INDEPENDENT_AMBULATORY_CARE_PROVIDER_SITE_OTHER): Payer: Medicaid Other | Admitting: Advanced Practice Midwife

## 2015-09-10 VITALS — BP 119/66 | HR 86 | Temp 98.5°F | Wt 164.7 lb

## 2015-09-10 DIAGNOSIS — O99013 Anemia complicating pregnancy, third trimester: Secondary | ICD-10-CM

## 2015-09-10 DIAGNOSIS — Z3483 Encounter for supervision of other normal pregnancy, third trimester: Secondary | ICD-10-CM

## 2015-09-10 DIAGNOSIS — D649 Anemia, unspecified: Secondary | ICD-10-CM

## 2015-09-10 LAB — POCT URINALYSIS DIP (DEVICE)
Bilirubin Urine: NEGATIVE
Glucose, UA: NEGATIVE mg/dL
Ketones, ur: 15 mg/dL — AB
LEUKOCYTES UA: NEGATIVE
Nitrite: NEGATIVE
Protein, ur: NEGATIVE mg/dL
SPECIFIC GRAVITY, URINE: 1.015 (ref 1.005–1.030)
UROBILINOGEN UA: 0.2 mg/dL (ref 0.0–1.0)
pH: 7 (ref 5.0–8.0)

## 2015-09-10 LAB — CBC
HCT: 30.3 % — ABNORMAL LOW (ref 36.0–46.0)
HEMOGLOBIN: 10.1 g/dL — AB (ref 12.0–15.0)
MCH: 29.4 pg (ref 26.0–34.0)
MCHC: 33.3 g/dL (ref 30.0–36.0)
MCV: 88.1 fL (ref 78.0–100.0)
MPV: 9.3 fL (ref 8.6–12.4)
PLATELETS: 310 10*3/uL (ref 150–400)
RBC: 3.44 MIL/uL — AB (ref 3.87–5.11)
RDW: 14 % (ref 11.5–15.5)
WBC: 8.3 10*3/uL (ref 4.0–10.5)
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
## 2015-09-10 NOTE — Patient Instructions (Signed)
Cesarean Delivery Cesarean delivery is the birth of a baby through a cut (incision) in the abdomen and womb (uterus).  LET YOUR HEALTH CARE PROVIDER KNOW ABOUT:  All medicines you are taking, including vitamins, herbs, eye drops, creams, and over-the-counter medicines.  Previous problems you or members of your family have had with the use of anesthetics.  Any bleeding or blood clotting disorders you have.  Family history of blood clots or bleeding disorders.  Any history of deep vein thrombosis (DVT) or pulmonary embolism (PE).  Previous surgeries you have had.  Medical conditions you have.  Any allergies you have.  Complicationsinvolving the pregnancy. RISKS AND COMPLICATIONS  Generally, this is a safe procedure. However, as with any procedure, complications can occur. Possible complications include:  Bleeding.  Infection.  Blood clots.  Injury to surrounding organs.  Problems with anesthesia.  Injury to the baby. BEFORE THE PROCEDURE   You may be given an antacid medicine to drink. This will prevent acid contents in your stomach from going into your lungs if you vomit during the surgery.  You may be given an antibiotic medicine to prevent infection. PROCEDURE   To prevent infection of your incision:  Hair may be removed from your pubic area if it is near your incision.  The skin of your pubic area and lower abdomen will be cleaned with a germ-killing solution (antiseptic).  A tube (Foley catheter) will be placed in your bladder to drain your urine from your bladder into a bag. This keeps your bladder empty during surgery.  An IV tube will be placed in your vein.  You may be given medicine to numb the lower half of your body (regional anesthetic). If you were in labor, you may have already had an epidural in place which can be used in both labor and cesarean delivery. You may possibly be given medicine to make you sleep (general anesthetic) though this is not as  common.  Your heart rate and your baby's heart rate will be monitored.  An incision will be made in your abdomen that extends to your uterus. There are 2 basic kinds of incisions:  The horizontal (transverse) incision. Horizontal incisions are from side to side and are used for most routine cesarean deliveries.  The vertical incision. The vertical incision is from the top of the abdomen to the bottom and is less commonly used. It is often done for women who have a serious complication (extreme prematurity) or under emergency situations.  The horizontal and vertical incisions may both be used at the same time. However, this is very uncommon.  An incision is then made in your uterus to deliver the baby.  Your baby will be delivered.  Your health care provider may place the baby on your chest. It is important to keep the baby warm. Your health care provider will dry off the baby, place the baby directly on your bare skin, and cover the baby with warm, dry blankets.  Both incisions will be closed with absorbable stitches. AFTER THE PROCEDURE   If you were awake during the surgery, you will see your baby right away. If you were asleep, you will see your baby as soon as you are awake.  You may breastfeed your baby after surgery.  You may be able to get up and walk the same day as the surgery. If you need to stay in bed for a period of time, you will receive help to turn, cough, and take deep breaths after   surgery. This helps prevent lung problems such as pneumonia.  Do not get out of bed alone the first time after surgery. You will need help getting out of bed until you are able to do this by yourself.  You may be able to shower the day after your cesarean delivery. After the bandage (dressing) is taken off the incision site, a nurse will assist you to shower if you would like help.  You may be directed to take actions to help prevent blood clots in your legs. These may  include:  Walking shortly after surgery, with someone assisting you. Moving around after surgery helps to improve blood flow.  Wearing compression stockings or using different types of devices.  Taking medicines to thin your blood (anticoagulants) if you are at high risk for DVT or PE.  Save any blood clots that you pass from your vagina. If you pass a clot while on the toilet, do not flush it. Call for the nurse. Tell the nurse if you think you are bleeding too much or passing too many clots.  You will be given medicine for pain and nausea as needed. Let your health care providers know if you are hurting. You may also be given an antibiotic to prevent an infection.  Your IV tube will be taken out when you are drinking a reasonable amount of fluids. The Foley catheter is taken out when you are up and walking.  If your blood type is Rh negative and your baby's blood type is Rh positive, you will be given a shot of anti-D immune globulin. This shot prevents you from having Rh problems with a future pregnancy. You should get the shot even if you had your tubes tied (tubal ligation).  If you are allowed to take the baby for a walk, place the baby in the bassinet and push it.   This information is not intended to replace advice given to you by your health care provider. Make sure you discuss any questions you have with your health care provider.   Document Released: 08/17/2005 Document Revised: 05/08/2015 Document Reviewed: 04/13/2012 Elsevier Interactive Patient Education 2016 Elsevier Inc.  

## 2015-09-13 ENCOUNTER — Ambulatory Visit (INDEPENDENT_AMBULATORY_CARE_PROVIDER_SITE_OTHER): Payer: Medicaid Other | Admitting: Obstetrics & Gynecology

## 2015-09-13 ENCOUNTER — Other Ambulatory Visit: Payer: Self-pay

## 2015-09-13 VITALS — BP 129/82 | HR 86 | Wt 166.2 lb

## 2015-09-13 DIAGNOSIS — Z3483 Encounter for supervision of other normal pregnancy, third trimester: Secondary | ICD-10-CM

## 2015-09-13 MED ORDER — CYCLOBENZAPRINE HCL 10 MG PO TABS: 10.0000 mg | ORAL_TABLET | Freq: Three times a day (TID) | ORAL | Status: DC | PRN

## 2015-09-13 MED ORDER — TRAMADOL HCL 50 MG PO TABS: 50.0000 mg | ORAL_TABLET | Freq: Four times a day (QID) | ORAL | Status: DC | PRN

## 2015-09-13 NOTE — Progress Notes (Signed)
Pt c/o pain and pressure in RLQ and in Right Leg.
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
## 2015-09-17 ENCOUNTER — Encounter: Payer: Self-pay | Admitting: Student

## 2015-09-17 ENCOUNTER — Ambulatory Visit (INDEPENDENT_AMBULATORY_CARE_PROVIDER_SITE_OTHER): Payer: Medicaid Other | Admitting: Student

## 2015-09-17 VITALS — BP 122/74 | HR 88 | Temp 98.6°F | Wt 167.3 lb

## 2015-09-17 DIAGNOSIS — N898 Other specified noninflammatory disorders of vagina: Secondary | ICD-10-CM

## 2015-09-17 DIAGNOSIS — Z3483 Encounter for supervision of other normal pregnancy, third trimester: Secondary | ICD-10-CM

## 2015-09-17 DIAGNOSIS — O26893 Other specified pregnancy related conditions, third trimester: Secondary | ICD-10-CM

## 2015-09-17 LAB — POCT URINALYSIS DIP (DEVICE)
Glucose, UA: NEGATIVE mg/dL
KETONES UR: NEGATIVE mg/dL
NITRITE: NEGATIVE
Protein, ur: 30 mg/dL — AB
Urobilinogen, UA: 1 mg/dL (ref 0.0–1.0)
pH: 6 (ref 5.0–8.0)

## 2015-09-17 NOTE — Progress Notes (Signed)
Breastfeeding tip of the week reviewed. 
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
## 2015-09-17 NOTE — Patient Instructions (Signed)
Fetal Movement Counts Patient Name: __________________________________________________ Patient Due Date: ____________________ Performing a fetal movement count is highly recommended in high-risk pregnancies, but it is good for every pregnant woman to do. Your health care provider may ask you to start counting fetal movements at 28 weeks of the pregnancy. Fetal movements often increase:  After eating a full meal.  After physical activity.  After eating or drinking something sweet or cold.  At rest. Pay attention to when you feel the baby is most active. This will help you notice a pattern of your baby's sleep and wake cycles and what factors contribute to an increase in fetal movement. It is important to perform a fetal movement count at the same time each day when your baby is normally most active.  HOW TO COUNT FETAL MOVEMENTS 1. Find a quiet and comfortable area to sit or lie down on your left side. Lying on your left side provides the best blood and oxygen circulation to your baby. 2. Write down the day and time on a sheet of paper or in a journal. 3. Start counting kicks, flutters, swishes, rolls, or jabs in a 2-hour period. You should feel at least 10 movements within 2 hours. 4. If you do not feel 10 movements in 2 hours, wait 2-3 hours and count again. Look for a change in the pattern or not enough counts in 2 hours. SEEK MEDICAL CARE IF:  You feel less than 10 counts in 2 hours, tried twice.  There is no movement in over an hour.  The pattern is changing or taking longer each day to reach 10 counts in 2 hours.  You feel the baby is not moving as he or she usually does. Date: ____________ Movements: ____________ Start time: ____________ Elizebeth Koller time: ____________  Date: ____________ Movements: ____________ Start time: ____________ Elizebeth Koller time: ____________ Date: ____________ Movements: ____________ Start time: ____________ Elizebeth Koller time: ____________ Date: ____________ Movements:  ____________ Start time: ____________ Elizebeth Koller time: ____________ Date: ____________ Movements: ____________ Start time: ____________ Elizebeth Koller time: ____________ Date: ____________ Movements: ____________ Start time: ____________ Elizebeth Koller time: ____________ Date: ____________ Movements: ____________ Start time: ____________ Elizebeth Koller time: ____________ Date: ____________ Movements: ____________ Start time: ____________ Elizebeth Koller time: ____________  Date: ____________ Movements: ____________ Start time: ____________ Elizebeth Koller time: ____________ Date: ____________ Movements: ____________ Start time: ____________ Elizebeth Koller time: ____________ Date: ____________ Movements: ____________ Start time: ____________ Elizebeth Koller time: ____________ Date: ____________ Movements: ____________ Start time: ____________ Elizebeth Koller time: ____________ Date: ____________ Movements: ____________ Start time: ____________ Elizebeth Koller time: ____________ Date: ____________ Movements: ____________ Start time: ____________ Elizebeth Koller time: ____________ Date: ____________ Movements: ____________ Start time: ____________ Elizebeth Koller time: ____________  Date: ____________ Movements: ____________ Start time: ____________ Elizebeth Koller time: ____________ Date: ____________ Movements: ____________ Start time: ____________ Elizebeth Koller time: ____________ Date: ____________ Movements: ____________ Start time: ____________ Elizebeth Koller time: ____________ Date: ____________ Movements: ____________ Start time: ____________ Elizebeth Koller time: ____________ Date: ____________ Movements: ____________ Start time: ____________ Elizebeth Koller time: ____________ Date: ____________ Movements: ____________ Start time: ____________ Elizebeth Koller time: ____________ Date: ____________ Movements: ____________ Start time: ____________ Elizebeth Koller time: ____________  Date: ____________ Movements: ____________ Start time: ____________ Elizebeth Koller time: ____________ Date: ____________ Movements: ____________ Start time: ____________ Elizebeth Koller  time: ____________ Date: ____________ Movements: ____________ Start time: ____________ Elizebeth Koller time: ____________ Date: ____________ Movements: ____________ Start time: ____________ Elizebeth Koller time: ____________ Date: ____________ Movements: ____________ Start time: ____________ Elizebeth Koller time: ____________ Date: ____________ Movements: ____________ Start time: ____________ Elizebeth Koller time: ____________ Date: ____________ Movements: ____________ Start time: ____________ Elizebeth Koller time: ____________  Date: ____________ Movements: ____________ Start time: ____________ Elizebeth Koller  time: ____________ Date: ____________ Movements: ____________ Start time: ____________ Elizebeth Koller time: ____________ Date: ____________ Movements: ____________ Start time: ____________ Elizebeth Koller time: ____________ Date: ____________ Movements: ____________ Start time: ____________ Elizebeth Koller time: ____________ Date: ____________ Movements: ____________ Start time: ____________ Elizebeth Koller time: ____________ Date: ____________ Movements: ____________ Start time: ____________ Elizebeth Koller time: ____________ Date: ____________ Movements: ____________ Start time: ____________ Elizebeth Koller time: ____________  Date: ____________ Movements: ____________ Start time: ____________ Elizebeth Koller time: ____________ Date: ____________ Movements: ____________ Start time: ____________ Elizebeth Koller time: ____________ Date: ____________ Movements: ____________ Start time: ____________ Elizebeth Koller time: ____________ Date: ____________ Movements: ____________ Start time: ____________ Elizebeth Koller time: ____________ Date: ____________ Movements: ____________ Start time: ____________ Elizebeth Koller time: ____________ Date: ____________ Movements: ____________ Start time: ____________ Elizebeth Koller time: ____________ Date: ____________ Movements: ____________ Start time: ____________ Elizebeth Koller time: ____________  Date: ____________ Movements: ____________ Start time: ____________ Elizebeth Koller time: ____________ Date: ____________  Movements: ____________ Start time: ____________ Elizebeth Koller time: ____________ Date: ____________ Movements: ____________ Start time: ____________ Elizebeth Koller time: ____________ Date: ____________ Movements: ____________ Start time: ____________ Elizebeth Koller time: ____________ Date: ____________ Movements: ____________ Start time: ____________ Elizebeth Koller time: ____________ Date: ____________ Movements: ____________ Start time: ____________ Elizebeth Koller time: ____________ Date: ____________ Movements: ____________ Start time: ____________ Elizebeth Koller time: ____________  Date: ____________ Movements: ____________ Start time: ____________ Elizebeth Koller time: ____________ Date: ____________ Movements: ____________ Start time: ____________ Elizebeth Koller time: ____________ Date: ____________ Movements: ____________ Start time: ____________ Elizebeth Koller time: ____________ Date: ____________ Movements: ____________ Start time: ____________ Elizebeth Koller time: ____________ Date: ____________ Movements: ____________ Start time: ____________ Elizebeth Koller time: ____________ Date: ____________ Movements: ____________ Start time: ____________ Elizebeth Koller time: ____________   This information is not intended to replace advice given to you by your health care provider. Make sure you discuss any questions you have with your health care provider.   Document Released: 09/16/2006 Document Revised: 09/07/2014 Document Reviewed: 06/13/2012 Elsevier Interactive Patient Education [employer]. Cesarean Delivery Cesarean delivery is the birth of a baby through a cut (incision) in the abdomen and womb (uterus).  LET [facility] CARE PROVIDER KNOW ABOUT:  All medicines you are taking, including vitamins, herbs, eye drops, creams, and over-the-counter medicines.  Previous problems you or members of your family have had with the use of anesthetics.  Any bleeding or blood clotting disorders you have.  Family history of blood clots or bleeding disorders.  Any history of deep  vein thrombosis (DVT) or pulmonary embolism (PE).  Previous surgeries you have had.  Medical conditions you have.  Any allergies you have.  Complicationsinvolving the pregnancy. RISKS AND COMPLICATIONS  Generally, this is a safe procedure. However, as with any procedure, complications can occur. Possible complications include: 5. Bleeding. 6. Infection. 7. Blood clots. 8. Injury to surrounding organs. 9. Problems with anesthesia. 10. Injury to the baby. BEFORE THE PROCEDURE   You may be given an antacid medicine to drink. This will prevent acid contents in your stomach from going into your lungs if you vomit during the surgery.  You may be given an antibiotic medicine to prevent infection. PROCEDURE   To prevent infection of your incision:  Hair may be removed from your pubic area if it is near your incision.  The skin of your pubic area and lower abdomen will be cleaned with a germ-killing solution (antiseptic).  A tube (Foley catheter) will be placed in your bladder to drain your urine from your bladder into a bag. This keeps your bladder empty during surgery.  An IV tube will be placed in your vein.  You may be given medicine to numb the  lower half of your body (regional anesthetic). If you were in labor, you may have already had an epidural in place which can be used in both labor and cesarean delivery. You may possibly be given medicine to make you sleep (general anesthetic) though this is not as common.  Your heart rate and your baby's heart rate will be monitored.  An incision will be made in your abdomen that extends to your uterus. There are 2 basic kinds of incisions:  The horizontal (transverse) incision. Horizontal incisions are from side to side and are used for most routine cesarean deliveries.  The vertical incision. The vertical incision is from the top of the abdomen to the bottom and is less commonly used. It is often done for women who have a serious  complication (extreme prematurity) or under emergency situations.  The horizontal and vertical incisions may both be used at the same time. However, this is very uncommon.  An incision is then made in your uterus to deliver the baby.  Your baby will be delivered.  Your health care provider may place the baby on your chest. It is important to keep the baby warm. Your health care provider will dry off the baby, place the baby directly on your bare skin, and cover the baby with warm, dry blankets.  Both incisions will be closed with absorbable stitches. AFTER THE PROCEDURE   If you were awake during the surgery, you will see your baby right away. If you were asleep, you will see your baby as soon as you are awake.  You may breastfeed your baby after surgery.  You may be able to get up and walk the same day as the surgery. If you need to stay in bed for a period of time, you will receive help to turn, cough, and take deep breaths after surgery. This helps prevent lung problems such as pneumonia.  Do not get out of bed alone the first time after surgery. You will need help getting out of bed until you are able to do this by yourself.  You may be able to shower the day after your cesarean delivery. After the bandage (dressing) is taken off the incision site, a nurse will assist you to shower if you would like help.  You may be directed to take actions to help prevent blood clots in your legs. These may include:  Walking shortly after surgery, with someone assisting you. Moving around after surgery helps to improve blood flow.  Wearing compression stockings or using different types of devices.  Taking medicines to thin your blood (anticoagulants) if you are at high risk for DVT or PE.  Save any blood clots that you pass from your vagina. If you pass a clot while on the toilet, do not flush it. Call for the nurse. Tell the nurse if you think you are bleeding too much or passing too many  clots.  You will be given medicine for pain and nausea as needed. Let your health care providers know if you are hurting. You may also be given an antibiotic to prevent an infection.  Your IV tube will be taken out when you are drinking a reasonable amount of fluids. The Foley catheter is taken out when you are up and walking.  If your blood type is Rh negative and your baby's blood type is Rh positive, you will be given a shot of anti-D immune globulin. This shot prevents you from having Rh problems with a future pregnancy.  You should get the shot even if you had your tubes tied (tubal ligation).  If you are allowed to take the baby for a walk, place the baby in the bassinet and push it.   This information is not intended to replace advice given to you by your health care provider. Make sure you discuss any questions you have with your health care provider.   Document Released: 08/17/2005 Document Revised: 05/08/2015 Document Reviewed: 04/13/2012 Elsevier Interactive Patient Education [employer].

## 2015-09-18 NOTE — Patient Instructions (Signed)
Your procedure is scheduled on:  Friday, Jan. 20, 2017  Enter through the [employer] [facility] at:  8:00 AM  Pick up the phone at the desk and dial [phone].  Call this number if you have problems the morning of surgery: [phone].  Remember:  Do NOT eat food or drink after:  Midnight Thursday Take these medicines the morning of surgery with a SIP OF WATER:  None  Do NOT wear jewelry (body piercing), metal hair clips/bobby pins, or nail polish. Do NOT wear lotions, powders, or perfumes.  You may wear deoderant. Do NOT shave for 48 hours prior to surgery. Do NOT bring valuables to the hospital.  Leave suitcase in car.  After surgery it may be brought to your room.  For patients admitted to the hospital, checkout time is 11:00 AM the day of discharge.

## 2015-09-19 ENCOUNTER — Encounter (HOSPITAL_COMMUNITY)
Admission: RE | Admit: 2015-09-19 | Discharge: 2015-09-19 | Disposition: A | Discharge status: Home / Self Care | Payer: Medicaid Other | Source: Ambulatory Visit | Attending: Obstetrics & Gynecology | Admitting: Obstetrics & Gynecology

## 2015-09-19 ENCOUNTER — Encounter (HOSPITAL_COMMUNITY): Payer: Self-pay

## 2015-09-19 LAB — CBC
HCT: 29.9 % — ABNORMAL LOW (ref 36.0–46.0)
Hemoglobin: 9.9 g/dL — ABNORMAL LOW (ref 12.0–15.0)
MCH: 28.9 pg (ref 26.0–34.0)
MCHC: 33.1 g/dL (ref 30.0–36.0)
MCV: 87.4 fL (ref 78.0–100.0)
PLATELETS: 237 10*3/uL (ref 150–400)
RBC: 3.42 MIL/uL — AB (ref 3.87–5.11)
RDW: 13.9 % (ref 11.5–15.5)
WBC: 7.8 10*3/uL (ref 4.0–10.5)

## 2015-09-20 ENCOUNTER — Inpatient Hospital Stay (HOSPITAL_COMMUNITY)
Admission: RE | Admit: 2015-09-20 | Discharge: 2015-09-23 | DRG: 766 | Disposition: A | Discharge status: Home / Self Care | Payer: Medicaid Other | Source: Ambulatory Visit | Attending: Obstetrics and Gynecology | Admitting: Obstetrics and Gynecology

## 2015-09-20 ENCOUNTER — Inpatient Hospital Stay (HOSPITAL_COMMUNITY): Payer: Medicaid Other | Admitting: Registered Nurse

## 2015-09-20 ENCOUNTER — Encounter (HOSPITAL_COMMUNITY): Payer: Self-pay | Admitting: Registered Nurse

## 2015-09-20 ENCOUNTER — Encounter (HOSPITAL_COMMUNITY)
Admission: RE | Disposition: A | Discharge status: Home / Self Care | Payer: Self-pay | Source: Ambulatory Visit | Attending: Obstetrics and Gynecology

## 2015-09-20 DIAGNOSIS — O9081 Anemia of the puerperium: Secondary | ICD-10-CM | POA: Diagnosis not present

## 2015-09-20 DIAGNOSIS — O34211 Maternal care for low transverse scar from previous cesarean delivery: Secondary | ICD-10-CM | POA: Diagnosis present

## 2015-09-20 DIAGNOSIS — Z98891 History of uterine scar from previous surgery: Secondary | ICD-10-CM

## 2015-09-20 DIAGNOSIS — D649 Anemia, unspecified: Secondary | ICD-10-CM | POA: Diagnosis not present

## 2015-09-20 DIAGNOSIS — Z3A39 39 weeks gestation of pregnancy: Secondary | ICD-10-CM

## 2015-09-20 LAB — PREPARE RBC (CROSSMATCH)

## 2015-09-20 LAB — RPR: RPR: NONREACTIVE

## 2015-09-20 SURGERY — Surgical Case
Anesthesia: Spinal | Site: Abdomen

## 2015-09-20 MED ORDER — KETOROLAC TROMETHAMINE 30 MG/ML IJ SOLN
30.0000 mg | Freq: Once | INTRAMUSCULAR | Status: AC
  Administered 2015-09-20: 30 mg via INTRAVENOUS

## 2015-09-20 MED ORDER — OXYCODONE-ACETAMINOPHEN 5-325 MG PO TABS: 1.0000 | ORAL_TABLET | Freq: Four times a day (QID) | ORAL | Status: DC | PRN

## 2015-09-20 MED ORDER — KETOROLAC TROMETHAMINE 30 MG/ML IJ SOLN
INTRAMUSCULAR | Status: AC
Start: 2015-09-20 — End: 2015-09-20
  Filled 2015-09-20: qty 1

## 2015-09-20 MED ORDER — MORPHINE SULFATE (PF) 0.5 MG/ML IJ SOLN
INTRAMUSCULAR | Status: AC
  Filled 2015-09-20: qty 10

## 2015-09-20 MED ORDER — LACTATED RINGERS IV SOLN: INTRAVENOUS | Status: DC

## 2015-09-20 MED ORDER — FENTANYL CITRATE (PF) 100 MCG/2ML IJ SOLN
INTRAMUSCULAR | Status: AC
  Filled 2015-09-20: qty 2

## 2015-09-20 MED ORDER — SIMETHICONE 80 MG PO CHEW
80.0000 mg | CHEWABLE_TABLET | ORAL | Status: DC
  Administered 2015-09-20 – 2015-09-22 (×3): 80 mg via ORAL
  Filled 2015-09-20 (×3): qty 1

## 2015-09-20 MED ORDER — SIMETHICONE 80 MG PO CHEW
80.0000 mg | CHEWABLE_TABLET | ORAL | Status: DC | PRN
  Administered 2015-09-21: 80 mg via ORAL
  Filled 2015-09-20: qty 1

## 2015-09-20 MED ORDER — SCOPOLAMINE 1 MG/3DAYS TD PT72
MEDICATED_PATCH | TRANSDERMAL | Status: AC
Start: 2015-09-20 — End: 2015-09-23
  Administered 2015-09-20: 1.5 mg via TRANSDERMAL
  Filled 2015-09-20: qty 1

## 2015-09-20 MED ORDER — LACTATED RINGERS IV SOLN
INTRAVENOUS | Status: DC
  Administered 2015-09-20 (×3): via INTRAVENOUS

## 2015-09-20 MED ORDER — DIPHENHYDRAMINE HCL 25 MG PO CAPS: 25.0000 mg | ORAL_CAPSULE | Freq: Four times a day (QID) | ORAL | Status: DC | PRN

## 2015-09-20 MED ORDER — IBUPROFEN 600 MG PO TABS
600.0000 mg | ORAL_TABLET | Freq: Four times a day (QID) | ORAL | Status: DC
  Administered 2015-09-20 – 2015-09-23 (×11): 600 mg via ORAL
  Filled 2015-09-20 (×11): qty 1

## 2015-09-20 MED ORDER — LACTATED RINGERS IV SOLN: 2.5000 [IU]/h | INTRAVENOUS | Status: AC

## 2015-09-20 MED ORDER — ONDANSETRON HCL 4 MG/2ML IJ SOLN: 4.0000 mg | Freq: Three times a day (TID) | INTRAMUSCULAR | Status: DC | PRN

## 2015-09-20 MED ORDER — METOCLOPRAMIDE HCL 5 MG/ML IJ SOLN
INTRAMUSCULAR | Status: DC | PRN
  Administered 2015-09-20: 10 mg via INTRAVENOUS

## 2015-09-20 MED ORDER — NALOXONE HCL 2 MG/2ML IJ SOSY
1.0000 ug/kg/h | PREFILLED_SYRINGE | INTRAVENOUS | Status: DC | PRN
  Filled 2015-09-20: qty 2

## 2015-09-20 MED ORDER — LANOLIN HYDROUS EX OINT
1.0000 | TOPICAL_OINTMENT | CUTANEOUS | Status: DC | PRN
Start: 2015-09-20 — End: 2015-09-23

## 2015-09-20 MED ORDER — SODIUM CHLORIDE 0.9 % IJ SOLN: 3.0000 mL | INTRAMUSCULAR | Status: DC | PRN

## 2015-09-20 MED ORDER — NALBUPHINE HCL 10 MG/ML IJ SOLN: 5.0000 mg | INTRAMUSCULAR | Status: DC | PRN

## 2015-09-20 MED ORDER — SCOPOLAMINE 1 MG/3DAYS TD PT72
1.0000 | MEDICATED_PATCH | Freq: Once | TRANSDERMAL | Status: DC
  Administered 2015-09-20: 1.5 mg via TRANSDERMAL

## 2015-09-20 MED ORDER — FENTANYL CITRATE (PF) 100 MCG/2ML IJ SOLN
INTRAMUSCULAR | Status: DC | PRN
  Administered 2015-09-20: 25 ug via INTRATHECAL

## 2015-09-20 MED ORDER — OXYTOCIN 10 UNIT/ML IJ SOLN
INTRAMUSCULAR | Status: AC
  Filled 2015-09-20: qty 4

## 2015-09-20 MED ORDER — LACTATED RINGERS IV SOLN
INTRAVENOUS | Status: DC | PRN
  Administered 2015-09-20: 10:00:00 via INTRAVENOUS

## 2015-09-20 MED ORDER — NALOXONE HCL 0.4 MG/ML IJ SOLN: 0.4000 mg | INTRAMUSCULAR | Status: DC | PRN

## 2015-09-20 MED ORDER — TRAMADOL HCL 50 MG PO TABS
100.0000 mg | ORAL_TABLET | Freq: Two times a day (BID) | ORAL | Status: DC | PRN
  Filled 2015-09-20: qty 2

## 2015-09-20 MED ORDER — OXYCODONE-ACETAMINOPHEN 5-325 MG PO TABS: 2.0000 | ORAL_TABLET | ORAL | Status: DC | PRN

## 2015-09-20 MED ORDER — MORPHINE SULFATE (PF) 0.5 MG/ML IJ SOLN
INTRAMUSCULAR | Status: DC | PRN
  Administered 2015-09-20: .1 mg via INTRATHECAL

## 2015-09-20 MED ORDER — CEFAZOLIN SODIUM-DEXTROSE 2-3 GM-% IV SOLR
2.0000 g | INTRAVENOUS | Status: AC
  Administered 2015-09-20: 2 g via INTRAVENOUS

## 2015-09-20 MED ORDER — SENNOSIDES-DOCUSATE SODIUM 8.6-50 MG PO TABS
2.0000 | ORAL_TABLET | ORAL | Status: DC
  Administered 2015-09-20 – 2015-09-22 (×3): 2 via ORAL
  Filled 2015-09-20 (×3): qty 2

## 2015-09-20 MED ORDER — SCOPOLAMINE 1 MG/3DAYS TD PT72: 1.0000 | MEDICATED_PATCH | Freq: Once | TRANSDERMAL | Status: DC

## 2015-09-20 MED ORDER — PHENYLEPHRINE 8 MG IN D5W 100 ML (0.08MG/ML) PREMIX OPTIME
INJECTION | INTRAVENOUS | Status: AC
  Filled 2015-09-20: qty 100

## 2015-09-20 MED ORDER — ZOLPIDEM TARTRATE 5 MG PO TABS: 5.0000 mg | ORAL_TABLET | Freq: Every evening | ORAL | Status: DC | PRN

## 2015-09-20 MED ORDER — WITCH HAZEL-GLYCERIN EX PADS: 1.0000 "application " | MEDICATED_PAD | CUTANEOUS | Status: DC | PRN

## 2015-09-20 MED ORDER — LACTATED RINGERS IV SOLN
INTRAVENOUS | Status: DC
  Administered 2015-09-20: 18:00:00 via INTRAVENOUS

## 2015-09-20 MED ORDER — NALBUPHINE HCL 10 MG/ML IJ SOLN: 5.0000 mg | Freq: Once | INTRAMUSCULAR | Status: DC | PRN

## 2015-09-20 MED ORDER — MENTHOL 3 MG MT LOZG: 1.0000 | LOZENGE | OROMUCOSAL | Status: DC | PRN

## 2015-09-20 MED ORDER — HYDROMORPHONE HCL 2 MG PO TABS
2.0000 mg | ORAL_TABLET | ORAL | Status: DC | PRN
  Administered 2015-09-20 – 2015-09-23 (×10): 2 mg via ORAL
  Filled 2015-09-20 (×10): qty 1

## 2015-09-20 MED ORDER — MEPERIDINE HCL 25 MG/ML IJ SOLN: 6.2500 mg | INTRAMUSCULAR | Status: DC | PRN

## 2015-09-20 MED ORDER — CEFAZOLIN SODIUM-DEXTROSE 2-3 GM-% IV SOLR
INTRAVENOUS | Status: AC
  Filled 2015-09-20: qty 50

## 2015-09-20 MED ORDER — PANTOPRAZOLE SODIUM 40 MG PO TBEC
40.0000 mg | DELAYED_RELEASE_TABLET | Freq: Once | ORAL | Status: AC
  Administered 2015-09-20: 40 mg via ORAL

## 2015-09-20 MED ORDER — OXYTOCIN 10 UNIT/ML IJ SOLN
40.0000 [IU] | INTRAVENOUS | Status: DC | PRN
  Administered 2015-09-20: 40 [IU] via INTRAVENOUS

## 2015-09-20 MED ORDER — FENTANYL CITRATE (PF) 100 MCG/2ML IJ SOLN: 25.0000 ug | INTRAMUSCULAR | Status: DC | PRN

## 2015-09-20 MED ORDER — TETANUS-DIPHTH-ACELL PERTUSSIS 5-2.5-18.5 LF-MCG/0.5 IM SUSP: 0.5000 mL | Freq: Once | INTRAMUSCULAR | Status: DC

## 2015-09-20 MED ORDER — SIMETHICONE 80 MG PO CHEW
80.0000 mg | CHEWABLE_TABLET | Freq: Three times a day (TID) | ORAL | Status: DC
  Administered 2015-09-20 – 2015-09-23 (×7): 80 mg via ORAL
  Filled 2015-09-20 (×7): qty 1

## 2015-09-20 MED ORDER — ONDANSETRON HCL 4 MG/2ML IJ SOLN
INTRAMUSCULAR | Status: DC | PRN
  Administered 2015-09-20: 4 mg via INTRAVENOUS

## 2015-09-20 MED ORDER — METOCLOPRAMIDE HCL 5 MG/ML IJ SOLN
INTRAMUSCULAR | Status: AC
  Filled 2015-09-20: qty 2

## 2015-09-20 MED ORDER — PANTOPRAZOLE SODIUM 40 MG PO TBEC
DELAYED_RELEASE_TABLET | ORAL | Status: AC
  Filled 2015-09-20: qty 1

## 2015-09-20 MED ORDER — FERROUS SULFATE 325 (65 FE) MG PO TABS
325.0000 mg | ORAL_TABLET | Freq: Two times a day (BID) | ORAL | Status: DC
  Administered 2015-09-20 – 2015-09-23 (×6): 325 mg via ORAL
  Filled 2015-09-20 (×6): qty 1

## 2015-09-20 MED ORDER — PHENYLEPHRINE 8 MG IN D5W 100 ML (0.08MG/ML) PREMIX OPTIME
INJECTION | INTRAVENOUS | Status: DC | PRN
  Administered 2015-09-20: 60 ug/min via INTRAVENOUS

## 2015-09-20 MED ORDER — PRENATAL MULTIVITAMIN CH
1.0000 | ORAL_TABLET | Freq: Every day | ORAL | Status: DC
  Administered 2015-09-21 – 2015-09-22 (×2): 1 via ORAL
  Filled 2015-09-20 (×2): qty 1

## 2015-09-20 MED ORDER — ONDANSETRON HCL 4 MG/2ML IJ SOLN
INTRAMUSCULAR | Status: AC
  Filled 2015-09-20: qty 2

## 2015-09-20 MED ORDER — OXYCODONE-ACETAMINOPHEN 5-325 MG PO TABS: 1.0000 | ORAL_TABLET | ORAL | Status: DC | PRN

## 2015-09-20 MED ORDER — DIBUCAINE 1 % RE OINT: 1.0000 "application " | TOPICAL_OINTMENT | RECTAL | Status: DC | PRN

## 2015-09-20 SURGICAL SUPPLY — 34 items
BRR ADH 6X5 SEPRAFILM 1 SHT (MISCELLANEOUS)
CLAMP CORD UMBIL (MISCELLANEOUS) IMPLANT
CONTAINER PREFILL 10% NBF 15ML (MISCELLANEOUS) IMPLANT
DRAPE SHEET LG 3/4 BI-LAMINATE (DRAPES) IMPLANT
DRSG OPSITE POSTOP 4X10 (GAUZE/BANDAGES/DRESSINGS) ×3 IMPLANT
DURAPREP 26ML APPLICATOR (WOUND CARE) ×3 IMPLANT
ELECT REM PT RETURN 9FT ADLT (ELECTROSURGICAL) ×3
ELECTRODE REM PT RTRN 9FT ADLT (ELECTROSURGICAL) ×1 IMPLANT
EXTRACTOR VACUUM M CUP 4 TUBE (SUCTIONS) IMPLANT
EXTRACTOR VACUUM M CUP 4' TUBE (SUCTIONS)
GLOVE BIOGEL PI IND STRL 6.5 (GLOVE) ×1 IMPLANT
GLOVE BIOGEL PI IND STRL 7.0 (GLOVE) ×1 IMPLANT
GLOVE BIOGEL PI INDICATOR 6.5 (GLOVE) ×2
GLOVE BIOGEL PI INDICATOR 7.0 (GLOVE) ×2
GLOVE SURG SS PI 6.0 STRL IVOR (GLOVE) ×3 IMPLANT
GOWN STRL REUS W/TWL LRG LVL3 (GOWN DISPOSABLE) ×6 IMPLANT
KIT ABG SYR 3ML LUER SLIP (SYRINGE) IMPLANT
NDL HYPO 25X5/8 SAFETYGLIDE (NEEDLE) IMPLANT
NEEDLE HYPO 25X5/8 SAFETYGLIDE (NEEDLE) IMPLANT
NS IRRIG 1000ML POUR BTL (IV SOLUTION) ×3 IMPLANT
PACK C SECTION WH (CUSTOM PROCEDURE TRAY) ×3 IMPLANT
PAD ABD 7.5X8 STRL (GAUZE/BANDAGES/DRESSINGS) ×2 IMPLANT
PAD OB MATERNITY 4.3X12.25 (PERSONAL CARE ITEMS) ×3 IMPLANT
PENCIL SMOKE EVAC W/HOLSTER (ELECTROSURGICAL) ×3 IMPLANT
RTRCTR C-SECT PINK 25CM LRG (MISCELLANEOUS) IMPLANT
SEPRAFILM MEMBRANE 5X6 (MISCELLANEOUS) IMPLANT
SPONGE GAUZE 4X4 12PLY STER LF (GAUZE/BANDAGES/DRESSINGS) ×4 IMPLANT
SPONGE LAP 18X18 X RAY DECT (DISPOSABLE) ×4 IMPLANT
SUT PLAIN 0 NONE (SUTURE) IMPLANT
SUT VIC AB 0 CT1 36 (SUTURE) ×14 IMPLANT
SUT VIC AB 4-0 KS 27 (SUTURE) ×3 IMPLANT
TAPE CLOTH SURG 4X10 WHT LF (GAUZE/BANDAGES/DRESSINGS) ×2 IMPLANT
TOWEL OR 17X24 6PK STRL BLUE (TOWEL DISPOSABLE) ×3 IMPLANT
TRAY FOLEY CATH SILVER 14FR (SET/KITS/TRAYS/PACK) ×3 IMPLANT
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
## 2015-09-20 NOTE — Lactation Note (Signed)
This note was copied from the chart of Katherine Cooley. Lactation Consultation Note  Patient Name: Katherine Cooley [id number] Date: 09/20/2015 Reason for consult: Initial assessment Baby at 6 hr of life and mom reports baby is latching well now. She does have a tiny blister in the middle of her R nipple that she reports is sore but getting better. L nipple looks normal. Encouraged mom to use her milk on her nipple, get deep latch every feeding, and ask for comfort gels if it gets worse. Mom stated that she can manually express, has seen colostrum bilaterally, given a spoon. She bf her older child for 4 months. After returning to work she was not able to keep up with pumping because of her long shifts. Discussed baby behavior, feeding frequency, baby belly size, voids, wt loss, and breast changes. Given lactation handouts. Aware of OP services and support group.    Maternal Data Has patient been taught Hand Expression?: Yes Does the patient have breastfeeding experience prior to this delivery?: Yes  Feeding Feeding Type: Breast Milk Length of feed: 6 min  LATCH Score/Interventions Latch: Grasps breast easily, tongue down, lips flanged, rhythmical sucking.  Audible Swallowing: Spontaneous and intermittent Intervention(s): Skin to skin;Hand expression;Alternate breast massage  Type of Nipple: Everted at rest and after stimulation  Comfort (Breast/Nipple): Filling, red/small blisters or bruises, mild/mod discomfort  Problem noted: Mild/Moderate discomfort Interventions (Mild/moderate discomfort): Reverse pressue  Hold (Positioning): No assistance needed to correctly position infant at breast.  LATCH Score: 9  Lactation Tools Discussed/Used WIC Program: Yes   Consult Status Consult Status: Follow-up Date: 09/21/15 Follow-up type: In-patient    Denzil Hughes 09/20/2015, 4:40 PM

## 2015-09-20 NOTE — Anesthesia Procedure Notes (Signed)

## 2015-09-20 NOTE — Anesthesia Preprocedure Evaluation (Signed)
Anesthesia Evaluation  Patient identified by MRN, date of birth, ID band Patient awake    Reviewed: Allergy & Precautions, H&P , Patient's Chart, lab work & pertinent test results  Airway Mallampati: II  TM Distance: >3 FB Neck ROM: full    Dental no notable dental hx.    Pulmonary    Pulmonary exam normal breath sounds clear to auscultation       Cardiovascular Exercise Tolerance: Good  Rhythm:regular Rate:Normal     Neuro/Psych    GI/Hepatic   Endo/Other    Renal/GU      Musculoskeletal   Abdominal   Peds  Hematology  (+) anemia ,   Anesthesia Other Findings   Reproductive/Obstetrics                             Anesthesia Physical Anesthesia Plan  ASA: II  Anesthesia Plan: Spinal   Post-op Pain Management:    Induction:   Airway Management Planned:   Additional Equipment:   Intra-op Plan:   Post-operative Plan:   Informed Consent: I have reviewed the patients History and Physical, chart, labs and discussed the procedure including the risks, benefits and alternatives for the proposed anesthesia with the patient or authorized representative who has indicated his/her understanding and acceptance.   Dental Advisory Given  Plan Discussed with: CRNA  Anesthesia Plan Comments: (Lab work confirmed with CRNA in room. Platelets okay. Discussed spinal anesthetic, and patient consents to the procedure:  included risk of possible headache,backache, failed block, allergic reaction, and nerve injury. This patient was asked if she had any questions or concerns before the procedure started. )        Anesthesia Quick Evaluation

## 2015-09-20 NOTE — Anesthesia Postprocedure Evaluation (Signed)
Anesthesia Post Note  Patient: Katherine Cooley  Procedure(s) Performed: Procedure(s) (LRB): CESAREAN SECTION (N/A)  Patient location during evaluation: PACU Anesthesia Type: Spinal Level of consciousness: awake Pain management: satisfactory to patient Vital Signs Assessment: post-procedure vital signs reviewed and stable Respiratory status: spontaneous breathing Cardiovascular status: blood pressure returned to baseline Postop Assessment: no headache and spinal receding Anesthetic complications: no    Last Vitals:  Filed Vitals:   09/20/15 1200 09/20/15 1215  BP: 114/74 111/81  Pulse: 54 63  Temp: 36.3 C 36.5 C  Resp: 13 15    Last Pain:  Filed Vitals:   09/20/15 1221  PainSc: 5                  Javonni Macke EDWARD

## 2015-09-20 NOTE — Transfer of Care (Signed)
Immediate Anesthesia Transfer of Care Note  Patient: Katherine Cooley  Procedure(s) Performed: Procedure(s): CESAREAN SECTION (N/A)  Patient Location: PACU  Anesthesia Type:Spinal  Level of Consciousness: awake, alert  and oriented  Airway & Oxygen Therapy: Patient Spontanous Breathing  Post-op Assessment: Report given to RN  Post vital signs: Reviewed  Last Vitals:  Filed Vitals:   09/20/15 0813  BP: 123/85  Pulse: 106  Temp: 37.3 C  Resp: 20    Complications: No apparent anesthesia complications

## 2015-09-20 NOTE — Op Note (Signed)
Katherine Cooley PROCEDURE DATE: 09/20/2015  PREOPERATIVE DIAGNOSIS: Intrauterine pregnancy at  [redacted]w[redacted]d weeks gestation; patient declines vag del attempt  POSTOPERATIVE DIAGNOSIS: The same  PROCEDURE:     Cesarean Section  SURGEON:  Dr. Mora Bellman  ASSISTANT: Dr. Si Raider   INDICATIONS: Katherine Cooley is a 35 y.o. G3P1011 at [redacted]w[redacted]d scheduled for cesarean section secondary to patient declines vag del attempt.  The risks of cesarean section discussed with the patient included but were not limited to: bleeding which may require transfusion or reoperation; infection which may require antibiotics; injury to bowel, bladder, ureters or other surrounding organs; injury to the fetus; need for additional procedures including hysterectomy in the event of a life-threatening hemorrhage; placental abnormalities wth subsequent pregnancies, incisional problems, thromboembolic phenomenon and other postoperative/anesthesia complications. The patient concurred with the proposed plan, giving informed written consent for the procedure.    FINDINGS:  Viable female infant in cephalic presentation.  Apgars 8 and 9, weight, 8 pounds and 4 ounces.  Clear amniotic fluid.  Intact placenta, three vessel cord.  Normal uterus, fallopian tubes and ovaries bilaterally. A thick band of adhesion was noted between the anterior surface of the uterus and anterior abdominal wall. On the patient's left side.  ANESTHESIA:    Spinal INTRAVENOUS FLUIDS:3300 ml ESTIMATED BLOOD LOSS: 1000 ml URINE OUTPUT:  250 ml SPECIMENS: Placenta sent to L&D COMPLICATIONS: None immediate  PROCEDURE IN DETAIL:  The patient received intravenous antibiotics and had sequential compression devices applied to her lower extremities while in the preoperative area.  She was then taken to the operating room where anesthesia was induced and was found to be adequate. A foley catheter was placed into her bladder and attached to Jet T[place]nham gravity. She was then placed  in a dorsal supine position with a leftward tilt, and prepped and draped in a sterile manner. After an adequate timeout was performed, a Pfannenstiel skin incision was made with scalpel and carried through to the underlying layer of fascia. The fascia was incised in the midline and this incision was extended bilaterally using the Mayo scissors. Kocher clamps were applied to the superior aspect of the fascial incision and the underlying rectus muscles were dissected off bluntly. A similar process was carried out on the inferior aspect of the facial incision. The rectus muscles were separated in the midline bluntly and the peritoneum was entered bluntly. The Alexis self-retaining retractor was introduced into the abdominal cavity. Attention was turned to the lower uterine segment where a transverse hysterotomy was made with a scalpel and extended bilaterally bluntly. The infant was successfully delivered, and cord was clamped and cut and infant was handed over to awaiting neonatology team. Uterine massage was then administered and the placenta delivered intact with three-vessel cord. The uterus was cleared of clot and debris.  The hysterotomy was closed with 0 Vicryl in a running locked fashion, and an imbricating layer was also placed with a 0 Vicryl. Overall, excellent hemostasis was noted. The pelvis copiously irrigated and cleared of all clot and debris. Hemostasis was confirmed on all surfaces.  The peritoneum and the muscles were reapproximated using 0 vicryl interrupted stitches. The fascia was then closed using 0 Vicryl in a running fashion.  The skin was closed in a subcuticular fashion using 3.0 Vicryl. The patient tolerated the procedure well. Sponge, lap, instrument and needle counts were correct x 2. She was taken to the recovery room in stable condition.    Katherine Cooley,PEGGYMD  09/20/2015 10:43 AM

## 2015-09-21 LAB — CBC
HEMATOCRIT: 21.3 % — AB (ref 36.0–46.0)
Hemoglobin: 7.1 g/dL — ABNORMAL LOW (ref 12.0–15.0)
MCH: 30 pg (ref 26.0–34.0)
MCHC: 34.3 g/dL (ref 30.0–36.0)
MCV: 87.7 fL (ref 78.0–100.0)
PLATELETS: 193 10*3/uL (ref 150–400)
RBC: 2.43 MIL/uL — AB (ref 3.87–5.11)
RDW: 14.2 % (ref 11.5–15.5)
WBC: 12.2 10*3/uL — ABNORMAL HIGH (ref 4.0–10.5)

## 2015-09-21 MED ORDER — KETOROLAC TROMETHAMINE 30 MG/ML IJ SOLN: 30.0000 mg | Freq: Once | INTRAMUSCULAR | Status: DC

## 2015-09-21 MED ORDER — HYDROCORTISONE 1 % EX CREA
TOPICAL_CREAM | Freq: Two times a day (BID) | CUTANEOUS | Status: DC | PRN
  Filled 2015-09-21: qty 28

## 2015-09-21 NOTE — Progress Notes (Signed)
Post Partum Day 1  Subjective:  Katherine Cooley is a 35 y.o. [id number] [redacted]w[redacted]d s/p elective RLTCS.  No acute events overnight.  Pt denies problems with ambulating, voiding or po intake.  She denies nausea or vomiting.  Pain is moderately controlled. Lochia Minimal.   Plan for birth control is nexplanon.   Method of Feeding: Breast    Objective: BP 123/62 mmHg  Pulse 81  Temp(Src) 98.4 F (36.9 C) (Oral)  Resp 20  SpO2 98%  LMP 12/19/2014  Breastfeeding? Unknown  Physical Exam:  General: alert, cooperative and no distress Lochia: normal flow Chest: CTAB Heart: RRR no m/r/g Abdomen: +BS, soft, nontender, fundus firm below umbilicus; dressing mild blood, c/d/i DVT Evaluation: No evidence of DVT seen on physical exam. Extremities: no edema   Recent Labs  09/19/15 1000 09/21/15 0525  HGB 9.9* 7.1*  HCT 29.9* 21.3*    Assessment/Plan:  ASSESSMENT: Katherine Cooley is a 35 y.o. [id number] [redacted]w[redacted]d PPD #1 s/p elective RLTCS doing well.   Plan for dc 1-2 days.    LOS: 1 day   [employer] 09/21/2015, 7:13 AM   CNM attestation Post Partum Day #1 I have seen and examined this patient and agree with above documentation in the resident's note.   Katherine Cooley is a 35 y.o. [id number] s/p rLTCS.  Pt denies problems with ambulating, voiding or po intake. Pain is well controlled.  Plan for birth control is Nexplanon.  Method of Feeding: breast  PE:  BP 121/72 mmHg  Pulse 76  Temp(Src) 98.1 F (36.7 C) (Oral)  Resp 18  SpO2 98%  LMP 12/19/2014  Breastfeeding? Unknown Fundus firm  Plan for discharge: 09/22/15  Serita Grammes, CNM 12:03 AM

## 2015-09-21 NOTE — Addendum Note (Signed)
Addendum  created 09/21/15 0829 by Jonna Munro, CRNA   Modules edited: Clinical Notes   Clinical Notes:  File: [id number]

## 2015-09-21 NOTE — Anesthesia Postprocedure Evaluation (Signed)
Anesthesia Post Note  Patient: Katherine Cooley  Procedure(s) Performed: Procedure(s) (LRB): CESAREAN SECTION (N/A)  Patient location during evaluation: Mother Baby Anesthesia Type: Spinal Level of consciousness: awake and alert and oriented Pain management: satisfactory to patient Vital Signs Assessment: post-procedure vital signs reviewed and stable Respiratory status: spontaneous breathing and nonlabored ventilation Cardiovascular status: stable Postop Assessment: no headache, no backache, patient able to bend at knees, no signs of nausea or vomiting and adequate PO intake Anesthetic complications: no    Last Vitals:  Filed Vitals:   09/21/15 0000 09/21/15 0400  BP: 123/62 123/62  Pulse: 81 81  Temp: 37.1 C 36.9 C  Resp: 20 20    Last Pain:  Filed Vitals:   09/21/15 0740  PainSc: 8                  Analleli Gierke

## 2015-09-22 MED ORDER — FERROUS SULFATE 325 (65 FE) MG PO TABS: 325.0000 mg | ORAL_TABLET | Freq: Two times a day (BID) | ORAL | Status: DC

## 2015-09-22 NOTE — Progress Notes (Addendum)
Subjective: Postpartum Day 2: Cesarean Delivery Eating, drinking, voiding, well. Walking back and forth to br w/o difficulty, but hasn't walked in hall. No flatus yet.  Lochia wnl. Reports soreness.  Denies dizziness, lightheadedness, or sob. Sore nipples.     Objective: Vital signs in last 24 hours: Temp:  [98.4 F (36.9 C)] 98.4 F (36.9 C) (01/21 1800) Pulse Rate:  [72-77] 72 (01/21 1800) Resp:  [19-20] 19 (01/21 1800) BP: (110-119)/(57-59) 110/59 mmHg (01/21 1800) SpO2:  [99 %] 99 % (01/21 1800)  Physical Exam:  General: alert, cooperative and no distress Lochia: appropriate Uterine Fundus: firm Incision: healing well, no significant drainage, no dehiscence, no significant erythema, honeycomb dressing intact DVT Evaluation: No evidence of DVT seen on physical exam. Negative Homan's sign. No cords or calf tenderness. No significant calf/ankle edema.   Recent Labs  09/19/15 1000 09/21/15 0525  HGB 9.9* 7.1*  HCT 29.9* 21.3*    Assessment/Plan: Status post Cesarean section. Doing well postoperatively.  Continue current care. Asymptomatic w/ anemia, continue Fe BID Soreness- to get out in hallway and walk today [place] to continue working w/ pt today Breast feeding Plans nexplanon for contraception Plan d/c tomorrow  Tawnya Crook 09/22/2015, 6:15 AM

## 2015-09-23 ENCOUNTER — Encounter (HOSPITAL_COMMUNITY): Payer: Self-pay | Admitting: Obstetrics and Gynecology

## 2015-09-23 LAB — TYPE AND SCREEN
ABO/RH(D): A POS
Antibody Screen: NEGATIVE
UNIT DIVISION: 0
Unit division: 0

## 2015-09-23 MED ORDER — DOCUSATE SODIUM 100 MG PO CAPS: 100.0000 mg | ORAL_CAPSULE | Freq: Two times a day (BID) | ORAL | Status: DC

## 2015-09-23 MED ORDER — IBUPROFEN 600 MG PO TABS: 600.0000 mg | ORAL_TABLET | Freq: Four times a day (QID) | ORAL | Status: DC | PRN

## 2015-09-23 MED ORDER — SENNOSIDES-DOCUSATE SODIUM 8.6-50 MG PO TABS: 1.0000 | ORAL_TABLET | Freq: Every day | ORAL | Status: DC

## 2015-09-23 MED ORDER — OXYCODONE-ACETAMINOPHEN 5-325 MG PO TABS: 1.0000 | ORAL_TABLET | Freq: Four times a day (QID) | ORAL | Status: DC | PRN

## 2015-09-23 NOTE — Lactation Note (Signed)
This note was copied from the chart of Katherine Chey Peas[place]. Lactation Consultation Note; Experienced BF mom reports baby is not opening her mouth very wide and her nipples are sore. Has healing positional stripes noted on both nipples using comfort gels. Observed latch.Encouraged mom to wait for wide open mouth and quickly pull her to breast. Reviewed her hand position- she has her fingers close to the nipple. Mom reports after a few minutes no pain only tugging. Breasts much heavier this morning. No questions at present. To call prn  Patient Name: Katherine Cooley [id number] Date: 09/23/2015 Reason for consult: Follow-up assessment   Maternal Data Formula Feeding for Exclusion: No Does the patient have breastfeeding experience prior to this delivery?: Yes  Feeding Feeding Type: Breast Fed  LATCH Score/Interventions Latch: Grasps breast easily, tongue down, lips flanged, rhythmical sucking.  Audible Swallowing: Spontaneous and intermittent  Type of Nipple: Everted at rest and after stimulation  Comfort (Breast/Nipple): Filling, red/small blisters or bruises, mild/mod discomfort  Problem noted: Mild/Moderate discomfort Interventions (Mild/moderate discomfort): Comfort gels;Hand expression  Hold (Positioning): Assistance needed to correctly position infant at breast and maintain latch. Intervention(s): Breastfeeding basics reviewed  LATCH Score: 8  Lactation Tools Discussed/Used     Consult Status Consult Status: Complete    Truddie Crumble 09/23/2015, 11:13 AM

## 2015-09-23 NOTE — Discharge Summary (Signed)
OB Discharge Summary     Patient Name: Katherine Cooley DOB: 1981-03-07 MRN: [id number]  Date of admission: 09/20/2015 Delivering MD: Laurey Arrow [place]   Date of discharge: 09/23/2015  Admitting diagnosis: cpt [phone] - REPEAT C-S Intrauterine pregnancy: [redacted]w[redacted]d     Secondary diagnosis:  Active Problems:   Status post repeat low transverse cesarean section  Additional problems: none      Discharge diagnosis: Term Pregnancy Delivered s/p scheduled elective repeat C-section                                                                                                Post partum procedures:none  Augmentation: n/a  Complications: None. EBL 1043ml. Hemoglobin POD#1 was 7.1 from 9.9. Patient was asked to continue Fe supplement BID.   Hospital course:  Sceduled C/S   35 y.o. yo [id number] at [redacted]w[redacted]d was admitted to the hospital 09/20/2015 for scheduled cesarean section with the following indication:Elective Repeat.  Membrane Rupture Time/Date: 10:06 AM ,09/20/2015   Patient delivered a Viable infant.09/20/2015  Details of operation can be found in separate operative note.  Pateint had an uncomplicated postpartum course.  She is ambulating, tolerating a regular diet, passing flatus, and urinating well. Patient is discharged home in stable condition on  09/23/2015   On day of discharge, patient noted of continued pain at incision site on ambulation. She was on Dilaudid and Ibuprofen while hospitalized due to Percocet being on her allergy list. Patient reports that she does not have allergy to Percocet; she only feels "jittery"; she would like to try this medication at home.         Physical exam  Filed Vitals:   09/21/15 1800 09/22/15 0634 09/22/15 1757 09/23/15 0543  BP: 110/59 123/64 121/61 121/72  Pulse: 72 80 84 76  Temp: 98.4 F (36.9 C) 98.1 F (36.7 C) 98 F (36.7 C) 98.1 F (36.7 C)  TempSrc: Oral Oral Oral Oral  Resp: 19 18 19 18   SpO2: 99% 99% 98%    General: alert, cooperative  and no distress Lochia: minimal Uterine Fundus: firm Incision: Dressing is clean, dry, and intact DVT Evaluation: No evidence of DVT seen on physical exam. No cords or calf tenderness. No significant calf/ankle edema. Labs: Lab Results  Component Value Date   WBC 12.2* 09/21/2015   HGB 7.1* 09/21/2015   HCT 21.3* 09/21/2015   MCV 87.7 09/21/2015   PLT 193 09/21/2015   CMP Latest Ref Rng 04/04/2015  Glucose 65 - 99 mg/dL 86  BUN 6 - 20 mg/dL 8  Creatinine 0.44 - 1.00 mg/dL 0.55  Sodium 135 - 145 mmol/L 134(L)  Potassium 3.5 - 5.1 mmol/L 3.2(L)  Chloride 101 - 111 mmol/L 104  CO2 22 - 32 mmol/L 22  Calcium 8.9 - 10.3 mg/dL 8.5(L)  Total Protein 6.5 - 8.1 g/dL 6.0(L)  Total Bilirubin 0.3 - 1.2 mg/dL 0.3  Alkaline Phos 38 - 126 U/L 30(L)  AST 15 - 41 U/L 18  ALT 14 - 54 U/L 13(L)    Discharge instruction: per After Visit Summary and "Baby and Me Booklet".  After visit meds:    Medication List    STOP taking these medications        cyclobenzaprine 10 MG tablet  Commonly known as:  FLEXERIL     traMADol 50 MG tablet  Commonly known as:  ULTRAM      TAKE these medications        docusate sodium 100 MG capsule  Commonly known as:  COLACE  Take 1 capsule (100 mg total) by mouth 2 (two) times daily.     ferrous sulfate 325 (65 FE) MG tablet  Commonly known as:  FERROUSUL  Take 1 tablet (325 mg total) by mouth 2 (two) times daily.     ibuprofen 600 MG tablet  Commonly known as:  ADVIL,MOTRIN  Take 1 tablet (600 mg total) by mouth every 6 (six) hours as needed for mild pain or moderate pain.     oxyCODONE-acetaminophen 5-325 MG tablet  Commonly known as:  PERCOCET/ROXICET  Take 1-2 tablets by mouth every 6 (six) hours as needed for severe pain.     prenatal multivitamin Tabs tablet  Take 1 tablet by mouth daily at 12 noon.     senna-docusate 8.6-50 MG tablet  Commonly known as:  Senokot-S  Take 1 tablet by mouth at bedtime.        Diet: routine  diet  Activity: Advance as tolerated. Pelvic rest for 6 weeks.   Outpatient follow up:6 weeks Follow up Appt: Future Appointments Date Time Provider [place]  10/31/2015 1:20 PM Seabron Spates, CNM [facility]   Follow up Visit:No Follow-up on file.  Postpartum contraception: Nexplanon  Newborn Data: Live born female  Birth Weight: 8 lb 3.9 oz (3740 g) APGAR: ,   Baby Feeding: Breast Disposition:home with mother   09/23/2015 Smiley Houseman, MD  PGY 1 Family Medicine

## 2015-09-23 NOTE — Discharge Instructions (Signed)
We prescribed you a bowel regimen since you are taking Iron supplements and Percocet for pain which can cause constipation. If your stool is too runny, you can always cut back on the medications for constipation (Colace and Senakot); you can titrate the medication for constipation so that you have at least 1 soft stool per day. You can remove your dressing on your incision on 1/25 (the fifth day after your surgery). Do not submerge the incision under water until it is completely healed. Please watch out for any signs of infection at the incision site: redness, increased tenderness, discharge from the site; if you do notice these signs, please seek medical care.   Cesarean Delivery, Care After Refer to this sheet in the next few weeks. These instructions provide you with information on caring for yourself after your procedure. Your health care provider may also give you specific instructions. Your treatment has been planned according to current medical practices, but problems sometimes occur. Call your health care provider if you have any problems or questions after you go home. HOME CARE INSTRUCTIONS  Only take over-the-counter or prescription medications as directed by your health care provider.  Do not drink alcohol, especially if you are breastfeeding or taking medication to relieve pain.  Do not chew or smoke tobacco.  Continue to use good perineal care. Good perineal care includes:  Wiping your perineum from front to back.  Keeping your perineum clean.  Check your surgical cut (incision) daily for increased redness, drainage, swelling, or separation of skin.  Clean your incision gently with soap and water every day, and then pat it dry. If your health care provider says it is okay, leave the incision uncovered. Use a bandage (dressing) if the incision is draining fluid or appears irritated. If the adhesive strips across the incision do not fall off within 7 days, carefully peel them  off.  Hug a pillow when coughing or sneezing until your incision is healed. This helps to relieve pain.  Do not use tampons or douche until your health care provider says it is okay.  Shower, wash your hair, and take tub baths as directed by your health care provider.  Wear a well-fitting bra that provides breast support.  Limit wearing support panties or control-top hose.  Drink enough fluids to keep your urine clear or pale yellow.  Eat high-fiber foods such as whole grain cereals and breads, brown rice, beans, and fresh fruits and vegetables every day. These foods may help prevent or relieve constipation.  Resume activities such as climbing stairs, driving, lifting, exercising, or traveling as directed by your health care provider.  Talk to your health care provider about resuming sexual activities. This is dependent upon your risk of infection, your rate of healing, and your comfort and desire to resume sexual activity.  Try to have someone help you with your household activities and your newborn for at least a few days after you leave the hospital.  Rest as much as possible. Try to rest or take a nap when your newborn is sleeping.  Increase your activities gradually.  Keep all of your scheduled postpartum appointments. It is very important to keep your scheduled follow-up appointments. At these appointments, your health care provider will be checking to make sure that you are healing physically and emotionally. SEEK MEDICAL CARE IF:   You are passing large clots from your vagina. Save any clots to show your health care provider.  You have a foul smelling discharge from your  vagina.  You have trouble urinating.  You are urinating frequently.  You have pain when you urinate.  You have a change in your bowel movements.  You have increasing redness, pain, or swelling near your incision.  You have pus draining from your incision.  Your incision is separating.  You have  painful, hard, or reddened breasts.  You have a severe headache.  You have blurred vision or see spots.  You feel sad or depressed.  You have thoughts of hurting yourself or your newborn.  You have questions about your care, the care of your newborn, or medications.  You are dizzy or light-headed.  You have a rash.  You have pain, redness, or swelling at the site of the removed intravenous access (IV) tube.  You have nausea or vomiting.  You stopped breastfeeding and have not had a menstrual period within 12 weeks of stopping.  You are not breastfeeding and have not had a menstrual period within 12 weeks of delivery.  You have a fever. SEEK IMMEDIATE MEDICAL CARE IF:  You have persistent pain.  You have chest pain.  You have shortness of breath.  You faint.  You have leg pain.  You have stomach pain.  Your vaginal bleeding saturates 2 or more sanitary pads in 1 hour. MAKE SURE YOU:   Understand these instructions.  Will watch your condition.  Will get help right away if you are not doing well or get worse.   This information is not intended to replace advice given to you by your health care provider. Make sure you discuss any questions you have with your health care provider.   Document Released: 05/09/2002 Document Revised: 09/07/2014 Document Reviewed: 04/13/2012 Elsevier Interactive Patient Education [employer].

## 2015-09-23 NOTE — Progress Notes (Signed)
UR chart review completed.  

## 2015-09-23 NOTE — Progress Notes (Signed)
Reviewed post-care/discharge instructions for patient herself and baby. Patient verbalized understanding and had no further questions. Discharge summary was signed and a copy given to patient along with a paper Rx for pain medication. Patient in process of gathering belongings. Plan will be to escort mother and baby in wheelchair to hospital entrance.

## 2015-09-30 NOTE — Discharge Summary (Signed)
OB Discharge Summary     Patient Name: Katherine Cooley DOB: 06/20/1981 MRN: [id number]  Date of admission: 09/20/2015 Delivering MD: Laurey Arrow [place]   Date of discharge: 09/30/2015  Admitting diagnosis: cpt [phone] - REPEAT C-S Intrauterine pregnancy: [redacted]w[redacted]d     Secondary diagnosis:  Active Problems:   Status post repeat low transverse cesarean section  Additional problems: none      Discharge diagnosis: Term Pregnancy Delivered s/p scheduled elective repeat C-section                                                                                                Post partum procedures:none  Augmentation: n/a  Complications: None. EBL 1092ml. Hemoglobin POD#1 was 7.1 from 9.9. Patient was asked to continue Fe supplement BID.   Hospital course:  Sceduled C/S   35 y.o. yo [id number] at [redacted]w[redacted]d was admitted to the hospital 09/20/2015 for scheduled cesarean section with the following indication:Elective Repeat.  Membrane Rupture Time/Date: 10:06 AM ,09/20/2015   Patient delivered a Viable infant.09/20/2015  Details of operation can be found in separate operative note.  Pateint had an uncomplicated postpartum course.  She is ambulating, tolerating a regular diet, passing flatus, and urinating well. Patient is discharged home in stable condition on  09/30/2015   On day of discharge, patient noted of continued pain at incision site on ambulation. She was on Dilaudid and Ibuprofen while hospitalized due to Percocet being on her allergy list. Patient reports that she does not have allergy to Percocet; she only feels "jittery"; she would like to try this medication at home.         Physical exam  Filed Vitals:   09/21/15 1800 09/22/15 0634 09/22/15 1757 09/23/15 0543  BP: 110/59 123/64 121/61 121/72  Pulse: 72 80 84 76  Temp: 98.4 F (36.9 C) 98.1 F (36.7 C) 98 F (36.7 C) 98.1 F (36.7 C)  TempSrc: Oral Oral Oral Oral  Resp: 19 18 19 18   SpO2: 99% 99% 98%    General: alert, cooperative  and no distress Lochia: minimal Uterine Fundus: firm Incision: Dressing is clean, dry, and intact DVT Evaluation: No evidence of DVT seen on physical exam. No cords or calf tenderness. No significant calf/ankle edema. Labs: Lab Results  Component Value Date   WBC 12.2* 09/21/2015   HGB 7.1* 09/21/2015   HCT 21.3* 09/21/2015   MCV 87.7 09/21/2015   PLT 193 09/21/2015   CMP Latest Ref Rng 04/04/2015  Glucose 65 - 99 mg/dL 86  BUN 6 - 20 mg/dL 8  Creatinine 0.44 - 1.00 mg/dL 0.55  Sodium 135 - 145 mmol/L 134(L)  Potassium 3.5 - 5.1 mmol/L 3.2(L)  Chloride 101 - 111 mmol/L 104  CO2 22 - 32 mmol/L 22  Calcium 8.9 - 10.3 mg/dL 8.5(L)  Total Protein 6.5 - 8.1 g/dL 6.0(L)  Total Bilirubin 0.3 - 1.2 mg/dL 0.3  Alkaline Phos 38 - 126 U/L 30(L)  AST 15 - 41 U/L 18  ALT 14 - 54 U/L 13(L)    Discharge instruction: per After Visit Summary and "Baby and Me Booklet".  After visit meds:    Medication List    STOP taking these medications        cyclobenzaprine 10 MG tablet  Commonly known as:  FLEXERIL     traMADol 50 MG tablet  Commonly known as:  ULTRAM      TAKE these medications        docusate sodium 100 MG capsule  Commonly known as:  COLACE  Take 1 capsule (100 mg total) by mouth 2 (two) times daily.     ferrous sulfate 325 (65 FE) MG tablet  Commonly known as:  FERROUSUL  Take 1 tablet (325 mg total) by mouth 2 (two) times daily.     ibuprofen 600 MG tablet  Commonly known as:  ADVIL,MOTRIN  Take 1 tablet (600 mg total) by mouth every 6 (six) hours as needed for mild pain or moderate pain.     oxyCODONE-acetaminophen 5-325 MG tablet  Commonly known as:  PERCOCET/ROXICET  Take 1-2 tablets by mouth every 6 (six) hours as needed for severe pain.     prenatal multivitamin Tabs tablet  Take 1 tablet by mouth daily at 12 noon.     senna-docusate 8.6-50 MG tablet  Commonly known as:  Senokot-S  Take 1 tablet by mouth at bedtime.        Diet: routine  diet  Activity: Advance as tolerated. Pelvic rest for 6 weeks.   Outpatient follow up:6 weeks Follow up Appt: Future Appointments Date Time Provider [place]  10/31/2015 1:20 PM Seabron Spates, CNM [facility]   Follow up Visit:No Follow-up on file.  Postpartum contraception: Nexplanon  Newborn Data: Live born female  Birth Weight: 8 lb 3.9 oz (3740 g) APGAR: ,   Baby Feeding: Breast Disposition:home with mother   09/30/2015 Abbott Pao, CNM  PGY 1 Family Medicine

## 2015-10-31 ENCOUNTER — Encounter: Payer: Self-pay | Admitting: Advanced Practice Midwife

## 2015-10-31 ENCOUNTER — Ambulatory Visit (INDEPENDENT_AMBULATORY_CARE_PROVIDER_SITE_OTHER): Payer: Medicaid Other | Admitting: Advanced Practice Midwife

## 2015-10-31 VITALS — BP 124/70 | HR 82 | Ht 65.5 in | Wt 146.6 lb

## 2015-10-31 DIAGNOSIS — Z30017 Encounter for initial prescription of implantable subdermal contraceptive: Secondary | ICD-10-CM

## 2015-10-31 DIAGNOSIS — Z3202 Encounter for pregnancy test, result negative: Secondary | ICD-10-CM | POA: Diagnosis not present

## 2015-10-31 LAB — POCT PREGNANCY, URINE: Preg Test, Ur: NEGATIVE

## 2015-10-31 MED ORDER — ETONOGESTREL 68 MG ~~LOC~~ IMPL
68.0000 mg | DRUG_IMPLANT | Freq: Once | SUBCUTANEOUS | Status: AC
  Administered 2015-10-31: 68 mg via SUBCUTANEOUS

## 2015-10-31 NOTE — Progress Notes (Signed)
Subjective:     Katherine Cooley is a 35 y.o. female who presents for a postpartum visit. She is 6 weeks postpartum following a low cervical transverse Cesarean section. I have fully reviewed the prenatal and intrapartum course. The delivery was at 22 gestational weeks. Outcome: repeat cesarean section, low transverse incision. Anesthesia: spinal. Postpartum course has been uneventful. Baby's course has been uneventful. Baby is feeding by breast. Bleeding no bleeding. Bowel function is normal. Bladder function is normal. Patient is not sexually active. Contraception method is Nexplanon. Postpartum depression screening: negative.  The following portions of the patient's history were reviewed and updated as appropriate: allergies, current medications, past family history, past medical history, past social history, past surgical history and problem list.  Review of Systems Pertinent items are noted in HPI.   Objective:    There were no vitals taken for this visit.  General:  alert, cooperative and no distress   Breasts:  inspection negative, no nipple discharge or bleeding, no masses or nodularity palpable  Lungs: clear to auscultation bilaterally  Heart:  regular rate and rhythm, S1, S2 normal, no murmur, click, rub or gallop  Abdomen: soft, non-tender; bowel sounds normal; no masses,  no organomegaly   LTCS incision well healed   Vulva:  not evaluated  Vagina: not evaluated  Cervix:  n/a  Corpus: not examined  Adnexa:  not evaluated  Rectal Exam: Not performed.        Patient given informed consent, she signed consent form. Pregnancy test was negative.  Appropriate time out taken.  Patient's left arm was prepped and draped in the usual sterile fashion.. The ruler used to measure and mark insertion area.  Patient was prepped with alcohol swab and then injected with 5 ml of 1 % lidocaine.  She was prepped with betadine, Nexplanon removed from packaging,  Device confirmed in needle, then inserted  full length of needle and withdrawn per handbook instructions.  There was minimal blood loss.  Patient insertion site covered with guaze and a pressure bandage to reduce any bruising.  The patient tolerated the procedure well and was given post procedure instructions. Return in about one month for Nexplanon check.    Assessment:     Normal  postpartum exam. Pap smear not done at today's visit.   Plan:    1. Contraception: Nexplanon 2. Discussed possible irregular bleeding with Nexplanon. Call if it becomes bothersome 3. Follow up in: 1 year or as needed.

## 2015-10-31 NOTE — Patient Instructions (Signed)
Etonogestrel implant What is this medicine? ETONOGESTREL (et oh noe JES trel) is a contraceptive (birth control) device. It is used to prevent pregnancy. It can be used for up to 3 years. This medicine may be used for other purposes; ask your health care provider or pharmacist if you have questions. What should I tell my health care provider before I take this medicine? They need to know if you have any of these conditions: -abnormal vaginal bleeding -blood vessel disease or blood clots -cancer of the breast, cervix, or liver -depression -diabetes -gallbladder disease -headaches -heart disease or recent heart attack -high blood pressure -high cholesterol -kidney disease -liver disease -renal disease -seizures -tobacco smoker -an unusual or allergic reaction to etonogestrel, other hormones, anesthetics or antiseptics, medicines, foods, dyes, or preservatives -pregnant or trying to get pregnant -breast-feeding How should I use this medicine? This device is inserted just under the skin on the inner side of your upper arm by a health care professional. Talk to your pediatrician regarding the use of this medicine in children. Special care may be needed. Overdosage: If you think you have taken too much of this medicine contact a poison control center or emergency room at once. NOTE: This medicine is only for you. Do not share this medicine with others. What if I miss a dose? This does not apply. What may interact with this medicine? Do not take this medicine with any of the following medications: -amprenavir -bosentan -fosamprenavir This medicine may also interact with the following medications: -barbiturate medicines for inducing sleep or treating seizures -certain medicines for fungal infections like ketoconazole and itraconazole -griseofulvin -medicines to treat seizures like carbamazepine, felbamate, oxcarbazepine, phenytoin,  topiramate -modafinil -phenylbutazone -rifampin -some medicines to treat HIV infection like atazanavir, indinavir, lopinavir, nelfinavir, tipranavir, ritonavir -St. John's wort This list may not describe all possible interactions. Give your health care provider a list of all the medicines, herbs, non-prescription drugs, or dietary supplements you use. Also tell them if you smoke, drink alcohol, or use illegal drugs. Some items may interact with your medicine. What should I watch for while using this medicine? This product does not protect you against HIV infection (AIDS) or other sexually transmitted diseases. You should be able to feel the implant by pressing your fingertips over the skin where it was inserted. Contact your doctor if you cannot feel the implant, and use a non-hormonal birth control method (such as condoms) until your doctor confirms that the implant is in place. If you feel that the implant may have broken or become bent while in your arm, contact your healthcare provider. What side effects may I notice from receiving this medicine? Side effects that you should report to your doctor or health care professional as soon as possible: -allergic reactions like skin rash, itching or hives, swelling of the face, lips, or tongue -breast lumps -changes in emotions or moods -depressed mood -heavy or prolonged menstrual bleeding -pain, irritation, swelling, or bruising at the insertion site -scar at site of insertion -signs of infection at the insertion site such as fever, and skin redness, pain or discharge -signs of pregnancy -signs and symptoms of a blood clot such as breathing problems; changes in vision; chest pain; severe, sudden headache; pain, swelling, warmth in the leg; trouble speaking; sudden numbness or weakness of the face, arm or leg -signs and symptoms of liver injury like dark yellow or brown urine; general ill feeling or flu-like symptoms; light-colored stools; loss of  appetite; nausea; right upper belly   pain; unusually weak or tired; yellowing of the eyes or skin -unusual vaginal bleeding, discharge -signs and symptoms of a stroke like changes in vision; confusion; trouble speaking or understanding; severe headaches; sudden numbness or weakness of the face, arm or leg; trouble walking; dizziness; loss of balance or coordination Side effects that usually do not require medical attention (Report these to your doctor or health care professional if they continue or are bothersome.): -acne -back pain -breast pain -changes in weight -dizziness -general ill feeling or flu-like symptoms -headache -irregular menstrual bleeding -nausea -sore throat -vaginal irritation or inflammation This list may not describe all possible side effects. Call your doctor for medical advice about side effects. You may report side effects to FDA at 1-800-FDA-1088. Where should I keep my medicine? This drug is given in a hospital or clinic and will not be stored at home. NOTE: This sheet is a summary. It may not cover all possible information. If you have questions about this medicine, talk to your doctor, pharmacist, or health care provider.    2016, [employer]vier/Gold Standard. (2014-06-01 14:07:06)  

## 2016-07-22 ENCOUNTER — Telehealth: Payer: Self-pay | Admitting: *Deleted

## 2016-07-22 NOTE — Telephone Encounter (Signed)
Pt left message stating that she has lumps in her arm which are turning into bruises because of the Nexplanon and has been getting her cycle 3x/month.  She wants to know what to do.

## 2016-07-28 NOTE — Telephone Encounter (Signed)
I called Katherine Cooley and left a message I was returning her call , please call our office if you are still  Having issues so we can discuss with you. Per chart review had nexplanon inserted 10/2015 during pp visit.

## 2016-09-01 IMAGING — US US OB COMP LESS 14 WK
1 series · 14 of 28 positions shown · non-contrast
Comparison: OB ultrasound dated 05/03/2012

CLINICAL DATA: Pregnant, right pelvic pain

EXAM:
OBSTETRIC <14 WK US AND TRANSVAGINAL OB US
TECHNIQUE: Both transabdominal and transvaginal ultrasound examinations were
performed for complete evaluation of the gestation as well as the
maternal uterus, adnexal regions, and pelvic cul-de-sac.
Transvaginal technique was performed to assess early pregnancy.

[Series 1: us ob comp less 14 wk · 14 of 50 slices shown]
[im 2/50]
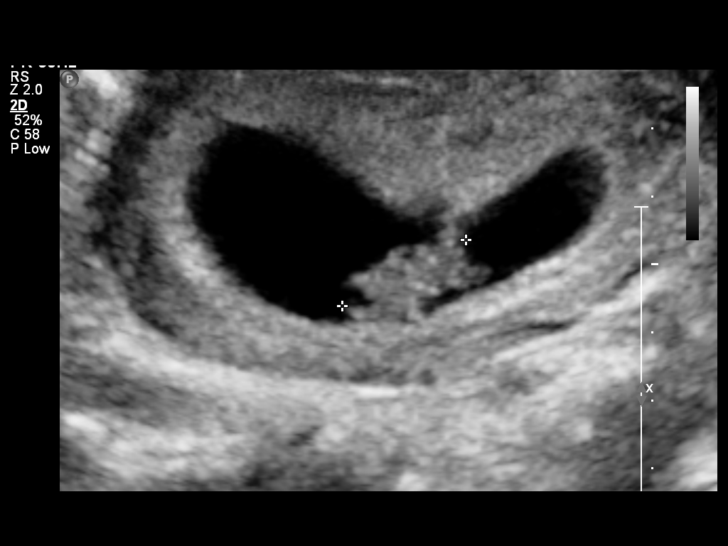
[im 6/50]
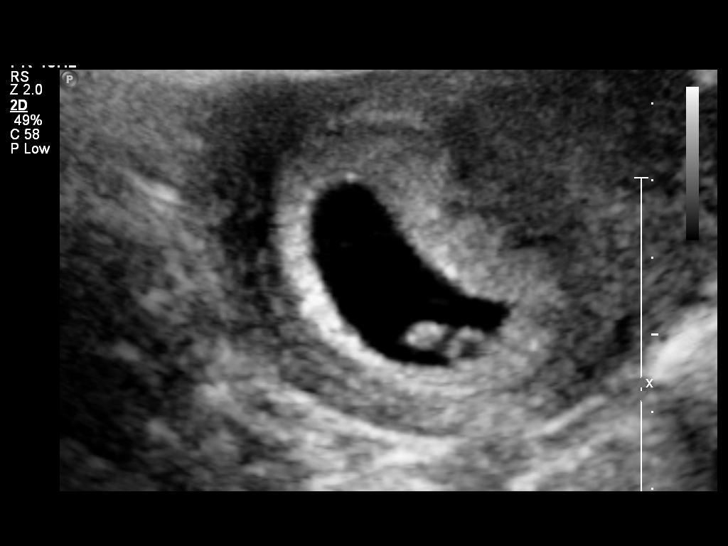
[im 10/50]
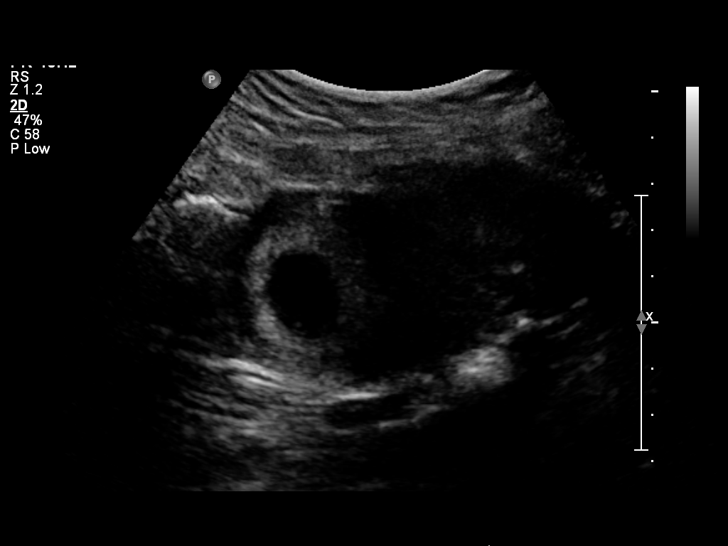
[im 13/50]
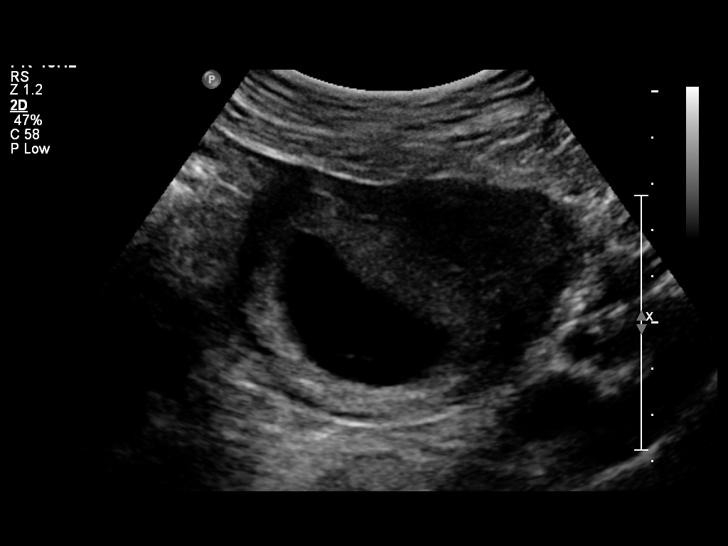
[im 17/50]
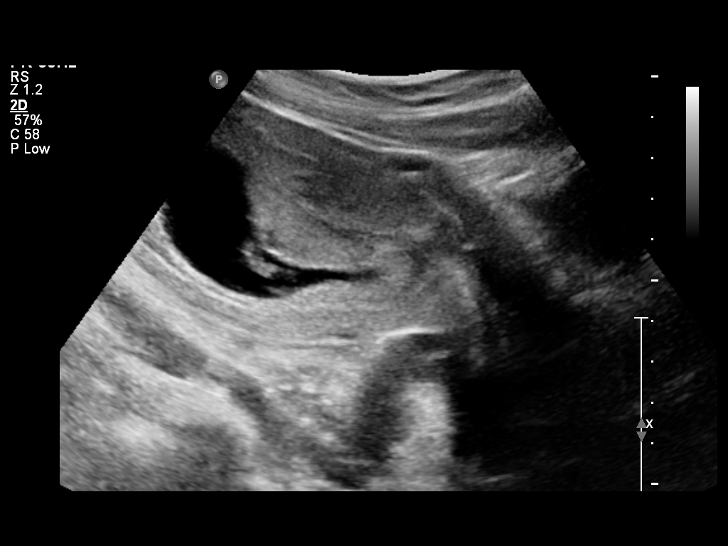
[im 20/50]
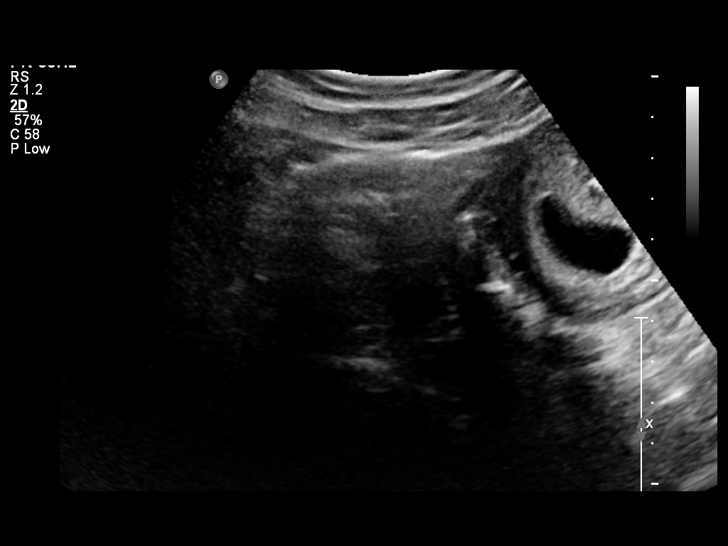
[im 24/50]
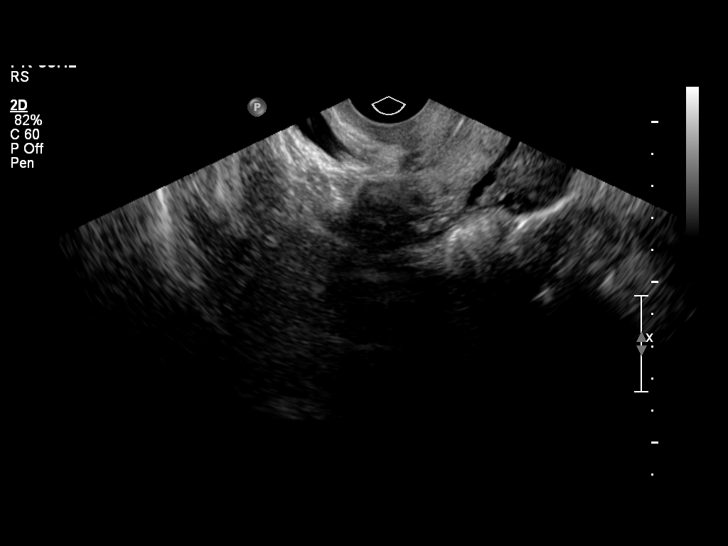
[im 28/50]
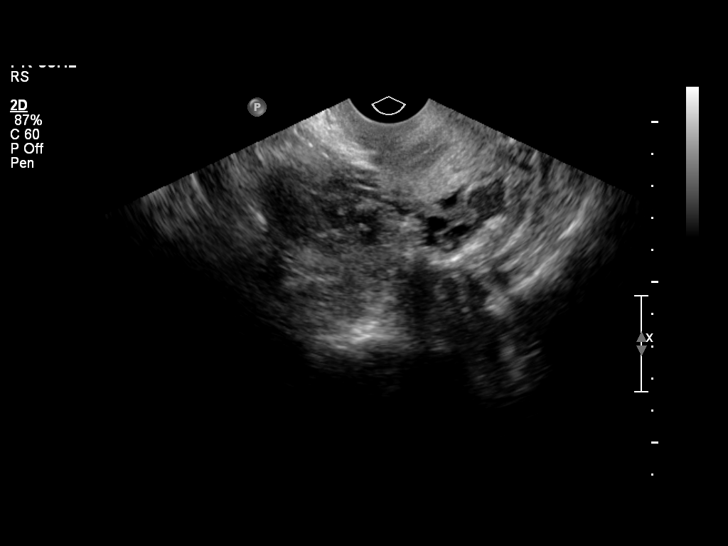
[im 31/50]
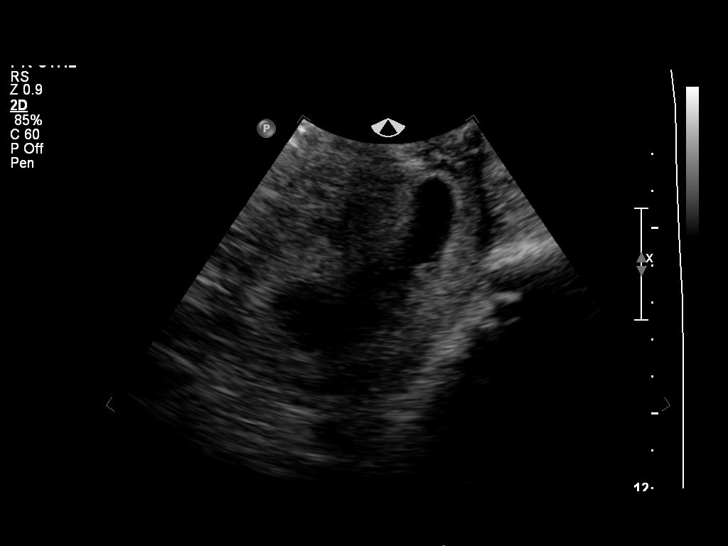
[im 35/50]
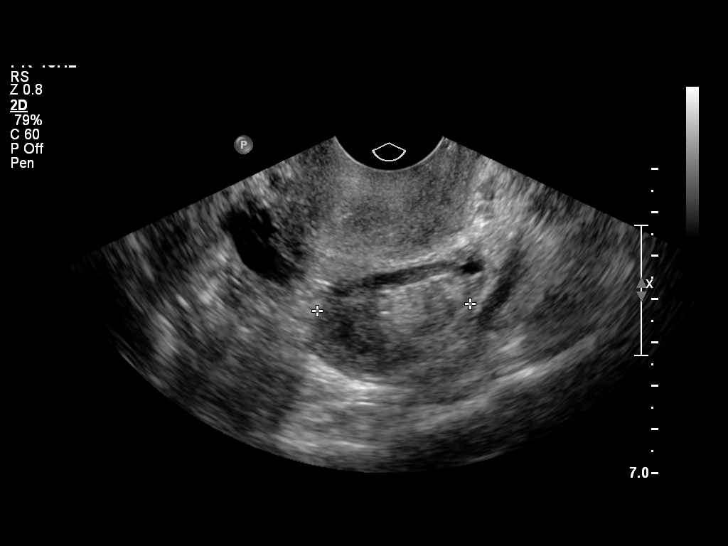
[im 39/50]
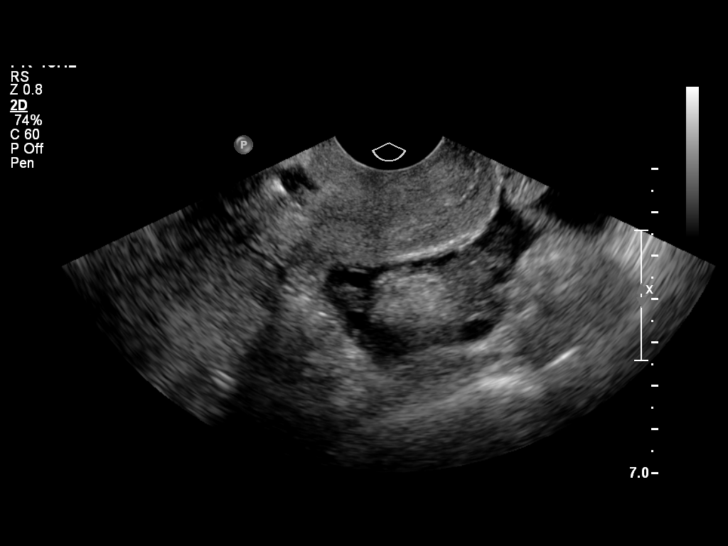
[im 42/50]
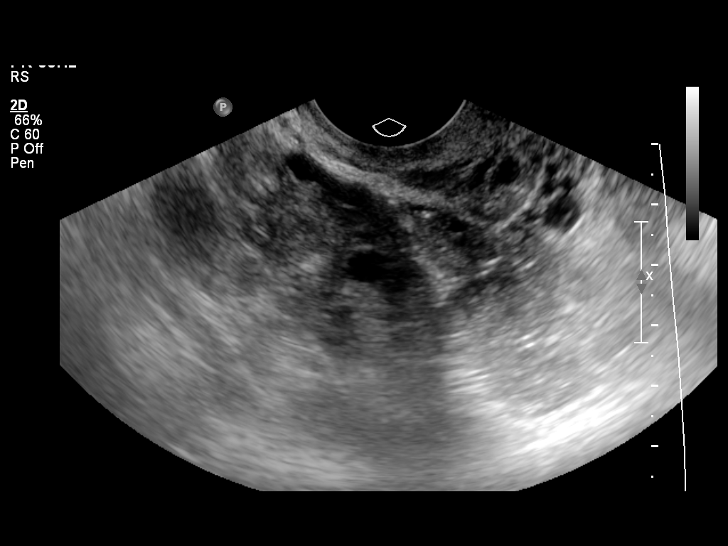
[im 46/50]
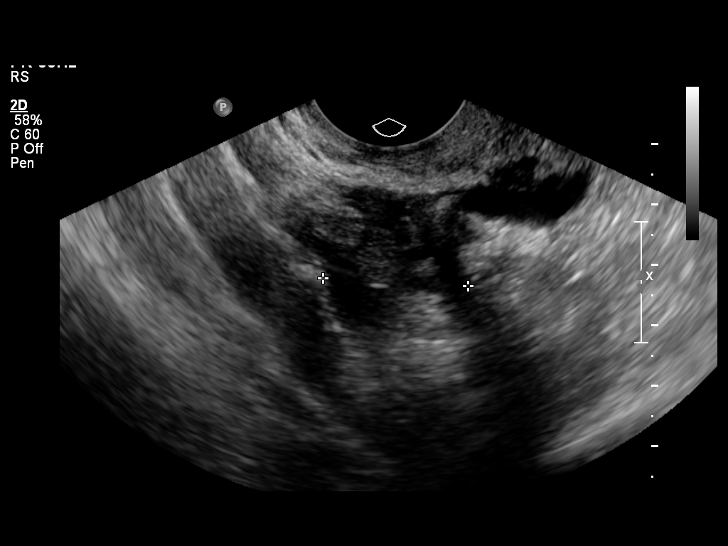
[im 50/50]
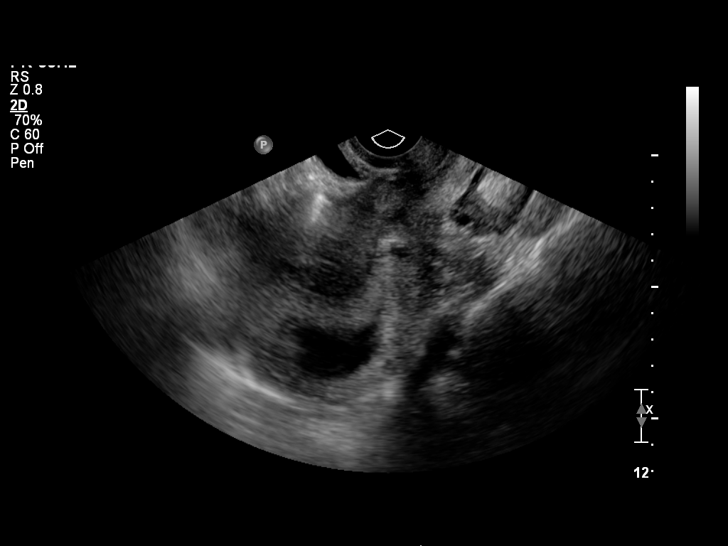

[14 of 28 positions shown; findings below may reference images not displayed]

FINDINGS: Intrauterine gestational sac: Visualized/normal in shape.

Yolk sac:  Present

Embryo:  Present

Cardiac Activity: Present

Heart Rate: 170  bpm

CRL:  20.8  mm   8 w   5 d                  US EDC: 09/26/2015

Maternal uterus/adnexae: No subchronic hemorrhage.

Left ovary measures 4.8 x 2.6 x 3.5 cm and is notable for a 2.3 x
2.4 x 1.7 cm dermoid, unchanged.

Right ovary measures 4.0 x 3.0 x 2.4 cm and is notable for a 3.3 x
2.7 x 3.7 cm dermoid, unchanged.

No free fluid.
IMPRESSION: Single live intrauterine gestation with estimated gestational age 8
weeks 5 days by crown-rump length.

Bilateral ovarian dermoids, as above, chronic.

## 2016-09-22 ENCOUNTER — Ambulatory Visit (INDEPENDENT_AMBULATORY_CARE_PROVIDER_SITE_OTHER): Payer: Medicaid Other | Admitting: *Deleted

## 2016-09-22 DIAGNOSIS — N898 Other specified noninflammatory disorders of vagina: Secondary | ICD-10-CM | POA: Diagnosis not present

## 2016-09-22 DIAGNOSIS — R34 Anuria and oliguria: Secondary | ICD-10-CM | POA: Diagnosis not present

## 2016-09-22 NOTE — Progress Notes (Signed)
Pt reports white vaginal discharge without irritation or itching.  She also is having frequent urination in small amounts. Pt instructed for self vaginal swab and urine collection. She will be contacted with any abnormal results.

## 2016-09-23 LAB — WET PREP, GENITAL
TRICH WET PREP: NONE SEEN
WBC, Wet Prep HPF POC: NONE SEEN
YEAST WET PREP: NONE SEEN

## 2016-09-23 LAB — URINE CULTURE

## 2016-10-13 ENCOUNTER — Ambulatory Visit: Payer: Self-pay | Admitting: Certified Nurse Midwife

## 2017-05-02 ENCOUNTER — Encounter (HOSPITAL_COMMUNITY): Payer: Self-pay | Admitting: Student

## 2017-05-02 ENCOUNTER — Inpatient Hospital Stay (HOSPITAL_COMMUNITY): Payer: Self-pay

## 2017-05-02 ENCOUNTER — Inpatient Hospital Stay (HOSPITAL_COMMUNITY)
Admission: AD | Admit: 2017-05-02 | Discharge: 2017-05-02 | Disposition: A | Discharge status: Home / Self Care | Payer: Self-pay | Source: Ambulatory Visit | Attending: Obstetrics & Gynecology | Admitting: Obstetrics & Gynecology

## 2017-05-02 DIAGNOSIS — Z885 Allergy status to narcotic agent status: Secondary | ICD-10-CM | POA: Insufficient documentation

## 2017-05-02 DIAGNOSIS — N83201 Unspecified ovarian cyst, right side: Secondary | ICD-10-CM

## 2017-05-02 DIAGNOSIS — R1031 Right lower quadrant pain: Secondary | ICD-10-CM | POA: Insufficient documentation

## 2017-05-02 DIAGNOSIS — D271 Benign neoplasm of left ovary: Secondary | ICD-10-CM

## 2017-05-02 DIAGNOSIS — Z79899 Other long term (current) drug therapy: Secondary | ICD-10-CM | POA: Insufficient documentation

## 2017-05-02 DIAGNOSIS — K5900 Constipation, unspecified: Secondary | ICD-10-CM

## 2017-05-02 LAB — URINALYSIS, ROUTINE W REFLEX MICROSCOPIC
Bacteria, UA: NONE SEEN
Bilirubin Urine: NEGATIVE
GLUCOSE, UA: NEGATIVE mg/dL
Ketones, ur: NEGATIVE mg/dL
Leukocytes, UA: NEGATIVE
Nitrite: NEGATIVE
PH: 6 (ref 5.0–8.0)
Protein, ur: NEGATIVE mg/dL
SPECIFIC GRAVITY, URINE: 1.026 (ref 1.005–1.030)

## 2017-05-02 LAB — CBC WITH DIFFERENTIAL/PLATELET
Basophils Absolute: 0 10*3/uL (ref 0.0–0.1)
Basophils Relative: 1 %
Eosinophils Absolute: 0.1 10*3/uL (ref 0.0–0.7)
Eosinophils Relative: 2 %
HEMATOCRIT: 37.8 % (ref 36.0–46.0)
HEMOGLOBIN: 13 g/dL (ref 12.0–15.0)
LYMPHS PCT: 47 %
Lymphs Abs: 3.1 10*3/uL (ref 0.7–4.0)
MCH: 31.2 pg (ref 26.0–34.0)
MCHC: 34.4 g/dL (ref 30.0–36.0)
MCV: 90.6 fL (ref 78.0–100.0)
MONO ABS: 0.2 10*3/uL (ref 0.1–1.0)
MONOS PCT: 2 %
NEUTROS PCT: 48 %
Neutro Abs: 3.2 10*3/uL (ref 1.7–7.7)
Platelets: 249 10*3/uL (ref 150–400)
RBC: 4.17 MIL/uL (ref 3.87–5.11)
RDW: 13 % (ref 11.5–15.5)
WBC: 6.7 10*3/uL (ref 4.0–10.5)

## 2017-05-02 LAB — WET PREP, GENITAL
SPERM: NONE SEEN
Trich, Wet Prep: NONE SEEN
Yeast Wet Prep HPF POC: NONE SEEN

## 2017-05-02 MED ORDER — LACTATED RINGERS IV SOLN: INTRAVENOUS | Status: DC

## 2017-05-02 MED ORDER — TRAMADOL HCL 50 MG PO TABS
50.0000 mg | ORAL_TABLET | Freq: Four times a day (QID) | ORAL | Status: DC | PRN
  Administered 2017-05-02: 50 mg via ORAL
  Filled 2017-05-02: qty 1

## 2017-05-02 MED ORDER — HYDROMORPHONE HCL 1 MG/ML IJ SOLN: 1.0000 mg | Freq: Once | INTRAMUSCULAR | Status: DC

## 2017-05-02 MED ORDER — HYDROMORPHONE HCL 1 MG/ML IJ SOLN: 1.0000 mg | INTRAMUSCULAR | Status: DC | PRN

## 2017-05-02 MED ORDER — PROMETHAZINE HCL 25 MG/ML IJ SOLN: 25.0000 mg | Freq: Once | INTRAMUSCULAR | Status: DC

## 2017-05-02 MED ORDER — PROMETHAZINE HCL 25 MG/ML IJ SOLN
12.5000 mg | Freq: Once | INTRAMUSCULAR | Status: AC
  Administered 2017-05-02: 12.5 mg via INTRAMUSCULAR
  Filled 2017-05-02: qty 1

## 2017-05-02 MED ORDER — TRAMADOL HCL 50 MG PO TABS: 50.0000 mg | ORAL_TABLET | Freq: Four times a day (QID) | ORAL | 0 refills | Status: DC | PRN

## 2017-05-02 MED ORDER — HYDROMORPHONE HCL 1 MG/ML IJ SOLN
1.0000 mg | Freq: Once | INTRAMUSCULAR | Status: AC
  Administered 2017-05-02: 1 mg via INTRAMUSCULAR
  Filled 2017-05-02: qty 1
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
## 2017-05-02 NOTE — MAU Note (Addendum)
Pt. Present with pain in lower abd, sharp, stabbing pain radiating to lower back.  Pain 10/10. Pain started this morning, ibuprofen 800 mg taken around noon-1300.  No relief. Currently on period, this pain occurs with period for the last 3 months, but today pain is worst. Pt. Does not feel like she is pregnant, birth control located in left arm.

## 2017-05-02 NOTE — Discharge Instructions (Signed)
Constipation, Adult Constipation is when a person:  Poops (has a bowel movement) fewer times in a week than normal.  Has a hard time pooping.  Has poop that is dry, hard, or bigger than normal.  Follow these instructions at home: Eating and drinking   Eat foods that have a lot of fiber, such as: ? Fresh fruits and vegetables. ? Whole grains. ? Beans.  Eat less of foods that are high in fat, low in fiber, or overly processed, such as: ? [place] fries. ? Hamburgers. ? Cookies. ? Candy. ? Soda.  Drink enough fluid to keep your pee (urine) clear or pale yellow. General instructions  Exercise regularly or as told by your doctor.  Go to the restroom when you feel like you need to poop. Do not hold it in.  Take over-the-counter and prescription medicines only as told by your doctor. These include any fiber supplements.  Do pelvic floor retraining exercises, such as: ? Doing deep breathing while relaxing your lower belly (abdomen). ? Relaxing your pelvic floor while pooping.  Watch your condition for any changes.  Keep all follow-up visits as told by your doctor. This is important. Contact a doctor if:  You have pain that gets worse.  You have a fever.  You have not pooped for 4 days.  You throw up (vomit).  You are not hungry.  You lose weight.  You are bleeding from the anus.  You have thin, pencil-like poop (stool). Get help right away if:  You have a fever, and your symptoms suddenly get worse.  You leak poop or have blood in your poop.  Your belly feels hard or bigger than normal (is bloated).  You have very bad belly pain.  You feel dizzy or you faint. This information is not intended to replace advice given to you by your health care provider. Make sure you discuss any questions you have with your health care provider. Document Released: 02/03/2008 Document Revised: 03/06/2016 Document Reviewed: 02/05/2016 Elsevier Interactive Patient Education   2017 [place]. Ovarian Cyst An ovarian cyst is a fluid-filled sac on an ovary. The ovaries are organs that make eggs in women. Most ovarian cysts go away on their own and are not cancerous (are benign). Some cysts need treatment. Follow these instructions at home:  Take over-the-counter and prescription medicines only as told by your doctor.  Do not drive or use heavy machinery while taking prescription pain medicine.  Get pelvic exams and Pap tests as often as told by your doctor.  Return to your normal activities as told by your doctor. Ask your doctor what activities are safe for you.  Do not use any products that contain nicotine or tobacco, such as cigarettes and e-cigarettes. If you need help quitting, ask your doctor.  Keep all follow-up visits as told by your doctor. This is important. Contact a doctor if:  Your periods are: ? Late. ? Irregular. ? Painful.  Your periods stop.  You have pelvic pain that does not go away.  You have pressure on your bladder.  You have trouble making your bladder empty when you pee (urinate).  You have pain during sex.  You have any of the following in your belly (abdomen): ? A feeling of fullness. ? Pressure. ? Discomfort. ? Pain that does not go away. ? Swelling.  You feel sick most of the time.  You have trouble pooping (have constipation).  You are not as hungry as usual (you lose your appetite).  You get very bad acne.  You start to have more hair on your body and face.  You are gaining weight or losing weight without changing your exercise and eating habits.  You think you may be pregnant. Get help right away if:  You have belly pain that is very bad or gets worse.  You cannot eat or drink without throwing up (vomiting).  You suddenly get a fever.  Your period is a lot heavier than usual. This information is not intended to replace advice given to you by your health care provider. Make sure you discuss any  questions you have with your health care provider. Document Released: 02/03/2008 Document Revised: 03/06/2016 Document Reviewed: 01/19/2016 Elsevier Interactive Patient Education  2017 [employer].

## 2017-05-06 LAB — GC/CHLAMYDIA PROBE AMP (~~LOC~~) NOT AT ARMC
Chlamydia: NEGATIVE
NEISSERIA GONORRHEA: NEGATIVE

## 2017-11-01 ENCOUNTER — Encounter: Payer: Self-pay | Admitting: *Deleted

## 2019-06-05 ENCOUNTER — Ambulatory Visit (INDEPENDENT_AMBULATORY_CARE_PROVIDER_SITE_OTHER): Payer: Medicaid Other | Admitting: Family Medicine

## 2019-06-05 ENCOUNTER — Other Ambulatory Visit: Payer: Self-pay

## 2019-06-05 ENCOUNTER — Encounter: Payer: Self-pay | Admitting: Family Medicine

## 2019-06-05 VITALS — BP 135/83 | HR 74 | Temp 98.7°F | Ht 65.5 in | Wt 139.1 lb

## 2019-06-05 DIAGNOSIS — Z3046 Encounter for surveillance of implantable subdermal contraceptive: Secondary | ICD-10-CM | POA: Diagnosis present

## 2019-06-05 MED ORDER — NORETHIN ACE-ETH ESTRAD-FE 1-20 MG-MCG(24) PO TABS: 1.0000 | ORAL_TABLET | Freq: Every day | ORAL | 3 refills | Status: DC

## 2019-06-05 NOTE — Progress Notes (Signed)
   Nexplanon Removal:  Patient given informed consent for removal of her Implanon, time out was performed.  Signed copy in the chart.  Appropriate time out taken. Implanon site identified.  Area prepped in usual sterile fashon. One cc of 1% lidocaine was used to anesthetize the area at the distal end of the implant. A small stab incision was made right beside the implant on the distal portion.  The implanon rod was grasped using hemostats and removed without difficulty.  There was less than 3 cc blood loss. There were no complications.  A small amount of antibiotic ointment and steri-strips were applied over the small incision.  A pressure bandage was applied to reduce any bruising.  The patient tolerated the procedure well and was given post procedure instructions. 

## 2019-07-10 ENCOUNTER — Other Ambulatory Visit (HOSPITAL_COMMUNITY)
Admission: RE | Admit: 2019-07-10 | Discharge: 2019-07-10 | Disposition: A | Discharge status: Home / Self Care | Payer: Medicaid Other | Source: Ambulatory Visit | Attending: Family Medicine | Admitting: Family Medicine

## 2019-07-10 ENCOUNTER — Ambulatory Visit (INDEPENDENT_AMBULATORY_CARE_PROVIDER_SITE_OTHER): Payer: Medicaid Other | Admitting: Family Medicine

## 2019-07-10 ENCOUNTER — Other Ambulatory Visit: Payer: Self-pay

## 2019-07-10 ENCOUNTER — Encounter: Payer: Self-pay | Admitting: Family Medicine

## 2019-07-10 VITALS — BP 146/93 | HR 77

## 2019-07-10 DIAGNOSIS — Z01419 Encounter for gynecological examination (general) (routine) without abnormal findings: Secondary | ICD-10-CM

## 2019-07-10 DIAGNOSIS — Z124 Encounter for screening for malignant neoplasm of cervix: Secondary | ICD-10-CM | POA: Diagnosis present

## 2019-07-10 DIAGNOSIS — Z0000 Encounter for general adult medical examination without abnormal findings: Secondary | ICD-10-CM
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
## 2019-07-13 LAB — CYTOLOGY - PAP
Adequacy: ABSENT
Chlamydia: NEGATIVE
Comment: NEGATIVE
Comment: NEGATIVE
Comment: NEGATIVE
Comment: NORMAL
Diagnosis: NEGATIVE
High risk HPV: NEGATIVE
Neisseria Gonorrhea: NEGATIVE
Trichomonas: NEGATIVE

## 2020-04-09 ENCOUNTER — Emergency Department (HOSPITAL_COMMUNITY)
Admission: EM | Admit: 2020-04-09 | Discharge: 2020-04-10 | Discharge status: Against Medical Advice | Payer: Medicaid Other

## 2020-04-09 ENCOUNTER — Other Ambulatory Visit: Payer: Self-pay

## 2020-05-01 ENCOUNTER — Other Ambulatory Visit: Payer: Self-pay | Admitting: Family Medicine

## 2020-05-07 ENCOUNTER — Other Ambulatory Visit: Payer: Self-pay | Admitting: Lactation Services

## 2020-05-07 ENCOUNTER — Telehealth: Payer: Self-pay | Admitting: Family Medicine

## 2020-05-07 MED ORDER — NORETHIN ACE-ETH ESTRAD-FE 1-20 MG-MCG(24) PO TABS: 1.0000 | ORAL_TABLET | Freq: Every day | ORAL | 2 refills | Status: DC

## 2020-05-07 NOTE — Progress Notes (Signed)
Patient called and wanted prescription sent to [facility]. Called patient to informed her to call [facility] to have prescription transferred.

## 2020-05-07 NOTE — Addendum Note (Signed)
Addended by: Donn Pierini on: 05/07/2020 01:19 PM   Modules accepted: Orders

## 2020-05-07 NOTE — Telephone Encounter (Signed)
Patient called into the office requesting a refill on her birth control. Patient is not due for an annual exam until after 11/9. Patient instructed that a message will be sent to the nurses and they will contact her as soon as they can. Patient verbalized understanding and message sent to clinical pool.

## 2020-05-07 NOTE — Telephone Encounter (Signed)
Returned patients call. Patient reports she does not have any more refills. Reviewed she should have refills at the pharmacy but pharmacy reports she does not. Patient did switch from filling every 3 months to filling monthly as her insurance changed.   Reviewed that I can refill for 3 months but then patient will need to follow up in the office for her yearly exam in November. Patient voiced understanding.

## 2020-07-29 ENCOUNTER — Telehealth: Payer: Self-pay | Admitting: Family Medicine

## 2020-07-29 ENCOUNTER — Ambulatory Visit: Payer: Medicaid Other | Admitting: Advanced Practice Midwife

## 2020-07-29 MED ORDER — NORETHIN ACE-ETH ESTRAD-FE 1-20 MG-MCG(24) PO TABS: 1.0000 | ORAL_TABLET | Freq: Every day | ORAL | 0 refills | Status: DC

## 2020-07-29 NOTE — Telephone Encounter (Signed)
I refill of OCP's given until patient has follow up with Provider on 12/28.

## 2020-07-29 NOTE — Telephone Encounter (Signed)
Pt states she is out of birth control pills and needs refilled but unable to make appt today, resch to 08-27-20.  Pt states she is at new pharmacy : [facility] on [address]. Please call pt if any questions

## 2020-07-29 NOTE — Addendum Note (Signed)
Addended by: Donn Pierini on: 07/29/2020 10:04 AM   Modules accepted: Orders

## 2020-08-09 ENCOUNTER — Ambulatory Visit (HOSPITAL_COMMUNITY)
Admission: EM | Admit: 2020-08-09 | Discharge: 2020-08-09 | Payer: BC Managed Care – PPO | Attending: Internal Medicine | Admitting: Internal Medicine

## 2020-08-09 ENCOUNTER — Encounter (HOSPITAL_COMMUNITY): Payer: Self-pay

## 2020-08-09 ENCOUNTER — Emergency Department (HOSPITAL_COMMUNITY)
Admission: EM | Admit: 2020-08-09 | Discharge: 2020-08-10 | Disposition: A | Discharge status: Home / Self Care | Payer: Medicaid Other | Attending: Emergency Medicine | Admitting: Emergency Medicine

## 2020-08-09 ENCOUNTER — Encounter (HOSPITAL_COMMUNITY): Payer: Self-pay | Admitting: Emergency Medicine

## 2020-08-09 ENCOUNTER — Other Ambulatory Visit: Payer: Self-pay

## 2020-08-09 DIAGNOSIS — R1030 Lower abdominal pain, unspecified: Secondary | ICD-10-CM | POA: Diagnosis not present

## 2020-08-09 DIAGNOSIS — R350 Frequency of micturition: Secondary | ICD-10-CM | POA: Insufficient documentation

## 2020-08-09 DIAGNOSIS — R1031 Right lower quadrant pain: Secondary | ICD-10-CM | POA: Insufficient documentation

## 2020-08-09 DIAGNOSIS — M545 Low back pain, unspecified: Secondary | ICD-10-CM | POA: Insufficient documentation

## 2020-08-09 DIAGNOSIS — Z5321 Procedure and treatment not carried out due to patient leaving prior to being seen by health care provider: Secondary | ICD-10-CM | POA: Insufficient documentation

## 2020-08-09 DIAGNOSIS — R34 Anuria and oliguria: Secondary | ICD-10-CM | POA: Insufficient documentation

## 2020-08-09 LAB — URINALYSIS, ROUTINE W REFLEX MICROSCOPIC
Bilirubin Urine: NEGATIVE
Glucose, UA: NEGATIVE mg/dL
Ketones, ur: 20 mg/dL — AB
Leukocytes,Ua: NEGATIVE
Nitrite: NEGATIVE
Protein, ur: NEGATIVE mg/dL
RBC / HPF: >50 RBC/hpf — ABNORMAL HIGH (ref 0–5)
Specific Gravity, Urine: 1.027 (ref 1.005–1.030)
pH: 5 (ref 5.0–8.0)

## 2020-08-09 LAB — COMPREHENSIVE METABOLIC PANEL
ALT: 27 U/L (ref 0–44)
AST: 26 U/L (ref 15–41)
Albumin: 4.1 g/dL (ref 3.5–5.0)
Alkaline Phosphatase: 28 U/L — ABNORMAL LOW (ref 38–126)
Anion gap: 9 (ref 5–15)
BUN: 10 mg/dL (ref 6–20)
CO2: 25 mmol/L (ref 22–32)
Calcium: 8.8 mg/dL — ABNORMAL LOW (ref 8.9–10.3)
Chloride: 104 mmol/L (ref 98–111)
Creatinine, Ser: 0.85 mg/dL (ref 0.44–1.00)
GFR, Estimated: >60 mL/min (ref >60)
Glucose, Bld: 88 mg/dL (ref 70–99)
Potassium: 3.6 mmol/L (ref 3.5–5.1)
Sodium: 138 mmol/L (ref 135–145)
Total Bilirubin: 0.8 mg/dL (ref 0.3–1.2)
Total Protein: 7.1 g/dL (ref 6.5–8.1)

## 2020-08-09 LAB — CBC
HCT: 40.7 % (ref 36.0–46.0)
Hemoglobin: 13.2 g/dL (ref 12.0–15.0)
MCH: 30.6 pg (ref 26.0–34.0)
MCHC: 32.4 g/dL (ref 30.0–36.0)
MCV: 94.4 fL (ref 80.0–100.0)
Platelets: 293 10*3/uL (ref 150–400)
RBC: 4.31 MIL/uL (ref 3.87–5.11)
RDW: 12.4 % (ref 11.5–15.5)
WBC: 5.6 10*3/uL (ref 4.0–10.5)
nRBC: 0 % (ref 0.0–0.2)

## 2020-08-09 LAB — POCT URINALYSIS DIPSTICK, ED / UC
Bilirubin Urine: NEGATIVE
Glucose, UA: NEGATIVE mg/dL
Ketones, ur: 15 mg/dL — AB
Leukocytes,Ua: NEGATIVE
Nitrite: NEGATIVE
Protein, ur: NEGATIVE mg/dL
Specific Gravity, Urine: >=1.03 (ref 1.005–1.030)
Urobilinogen, UA: 1 mg/dL (ref 0.0–1.0)
pH: 6.5 (ref 5.0–8.0)

## 2020-08-09 LAB — I-STAT BETA HCG BLOOD, ED (MC, WL, AP ONLY): I-stat hCG, quantitative: <5 m[IU]/mL (ref <5)

## 2020-08-09 LAB — LIPASE, BLOOD: Lipase: 23 U/L (ref 11–51)

## 2020-08-09 NOTE — ED Notes (Signed)
Patient is being discharged from the Urgent Care and sent to the Emergency Department via personal vehicle . Per Dr Lanny Cramp, patient is in need of higher level of care due to severe abdominal pain. Patient is aware and verbalizes understanding of plan of care.   Vitals:   08/09/20 1428  BP: (!) 185/97  Pulse: 67  Resp: 17  Temp: 99.3 F (37.4 C)  SpO2: 98%
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
## 2020-08-09 NOTE — Discharge Instructions (Signed)
You will need further evaluation in the emergency department because of: 1.  Severity and the duration of the abdominal pain 2.  Persistence of pain despite over-the-counter pain medications

## 2020-08-09 NOTE — ED Triage Notes (Signed)
Pt arrives to ED with c/o abdominal pain x1 week. Pt states pain is umbilical and travels to RLQ then to lower back. Pt is currently experiencing urinary frequency, urgency, and oliguria. Pt denies N/V/D, fevers, CP.

## 2020-08-09 NOTE — ED Triage Notes (Signed)
Pt c/o stomach pain x 1week. Pt states she was taking OTC medicine. She states the pain is radiating to the lower back. She states it hurts to sit up, pt states she has been using heating pads all day and night, but it does not relieve the pain. Pt states she has started to feel light headed. Pt states she feels pressure on her head. Pt states she is experiencing frequency and urgency.

## 2020-08-10 ENCOUNTER — Emergency Department (HOSPITAL_COMMUNITY): Payer: BC Managed Care – PPO

## 2020-08-10 ENCOUNTER — Encounter (HOSPITAL_COMMUNITY): Payer: Self-pay | Admitting: *Deleted

## 2020-08-10 ENCOUNTER — Emergency Department (HOSPITAL_COMMUNITY)
Admission: EM | Admit: 2020-08-10 | Discharge: 2020-08-10 | Disposition: A | Discharge status: Home / Self Care | Payer: BC Managed Care – PPO | Attending: Emergency Medicine | Admitting: Emergency Medicine

## 2020-08-10 DIAGNOSIS — N839 Noninflammatory disorder of ovary, fallopian tube and broad ligament, unspecified: Secondary | ICD-10-CM | POA: Diagnosis not present

## 2020-08-10 DIAGNOSIS — N838 Other noninflammatory disorders of ovary, fallopian tube and broad ligament: Secondary | ICD-10-CM

## 2020-08-10 DIAGNOSIS — R109 Unspecified abdominal pain: Secondary | ICD-10-CM

## 2020-08-10 DIAGNOSIS — Z91040 Latex allergy status: Secondary | ICD-10-CM | POA: Diagnosis not present

## 2020-08-10 DIAGNOSIS — N390 Urinary tract infection, site not specified: Secondary | ICD-10-CM

## 2020-08-10 DIAGNOSIS — R1011 Right upper quadrant pain: Secondary | ICD-10-CM | POA: Diagnosis not present

## 2020-08-10 DIAGNOSIS — R52 Pain, unspecified: Secondary | ICD-10-CM

## 2020-08-10 LAB — COMPREHENSIVE METABOLIC PANEL
ALT: 33 U/L (ref 0–44)
AST: 29 U/L (ref 15–41)
Albumin: 4.8 g/dL (ref 3.5–5.0)
Alkaline Phosphatase: 31 U/L — ABNORMAL LOW (ref 38–126)
Anion gap: 11 (ref 5–15)
BUN: 12 mg/dL (ref 6–20)
CO2: 23 mmol/L (ref 22–32)
Calcium: 8.9 mg/dL (ref 8.9–10.3)
Chloride: 103 mmol/L (ref 98–111)
Creatinine, Ser: 0.8 mg/dL (ref 0.44–1.00)
GFR, Estimated: >60 mL/min (ref >60)
Glucose, Bld: 92 mg/dL (ref 70–99)
Potassium: 3.5 mmol/L (ref 3.5–5.1)
Sodium: 137 mmol/L (ref 135–145)
Total Bilirubin: 0.6 mg/dL (ref 0.3–1.2)
Total Protein: 8.4 g/dL — ABNORMAL HIGH (ref 6.5–8.1)

## 2020-08-10 LAB — CBC
HCT: 43.7 % (ref 36.0–46.0)
Hemoglobin: 14.6 g/dL (ref 12.0–15.0)
MCH: 31.2 pg (ref 26.0–34.0)
MCHC: 33.4 g/dL (ref 30.0–36.0)
MCV: 93.4 fL (ref 80.0–100.0)
Platelets: 327 10*3/uL (ref 150–400)
RBC: 4.68 MIL/uL (ref 3.87–5.11)
RDW: 12.5 % (ref 11.5–15.5)
WBC: 6.2 10*3/uL (ref 4.0–10.5)
nRBC: 0 % (ref 0.0–0.2)

## 2020-08-10 MED ORDER — CEPHALEXIN 500 MG PO CAPS: 500.0000 mg | ORAL_CAPSULE | Freq: Four times a day (QID) | ORAL | 0 refills | Status: DC

## 2020-08-10 MED ORDER — SODIUM CHLORIDE 0.9 % IV BOLUS
500.0000 mL | Freq: Once | INTRAVENOUS | Status: AC
  Administered 2020-08-10: 18:00:00 500 mL via INTRAVENOUS

## 2020-08-10 MED ORDER — MORPHINE SULFATE (PF) 2 MG/ML IV SOLN
2.0000 mg | Freq: Once | INTRAVENOUS | Status: AC
  Administered 2020-08-10: 18:00:00 2 mg via INTRAVENOUS
  Filled 2020-08-10: qty 1

## 2020-08-10 MED ORDER — SODIUM CHLORIDE 0.9 % IV SOLN
1.0000 g | Freq: Once | INTRAVENOUS | Status: AC
  Administered 2020-08-10: 20:00:00 1 g via INTRAVENOUS
  Filled 2020-08-10: qty 10

## 2020-08-10 NOTE — ED Notes (Signed)
Patient refused to get change into gown. Patient state she is only her to get her lab result from [facility] that were done yesterday and she need pain medication.

## 2020-08-10 NOTE — ED Notes (Signed)
Refused vitals 

## 2020-08-10 NOTE — ED Triage Notes (Signed)
Pt went to UC yesterday and was sent to [facility] for abd work up. Pain in abd radiating to back. Generalized abd pain, distended abd. Pt had blood work at [facility] yesterday, did not wait for results.

## 2020-08-10 NOTE — ED Notes (Signed)
Patient left before nurse could gather signature, d/c instructions provided

## 2020-08-10 NOTE — Discharge Instructions (Signed)
BiliYou appear to have urinary tract infection.  You are given antibiotics here and have a prescription for antibiotics. However, if you have worsening pain, fever, or inability to tolerate things by mouth, please return to the emergency department for reevaluation. As discussed, keep your follow-up appointment with your gynecologist for evaluation of your ovarian masses.
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
## 2020-08-12 LAB — URINE CULTURE

## 2020-08-17 ENCOUNTER — Other Ambulatory Visit: Payer: Self-pay | Admitting: Obstetrics and Gynecology

## 2020-08-27 ENCOUNTER — Other Ambulatory Visit: Payer: Self-pay | Admitting: Obstetrics and Gynecology

## 2020-08-27 ENCOUNTER — Ambulatory Visit: Payer: Medicaid Other | Admitting: Obstetrics and Gynecology

## 2020-08-27 DIAGNOSIS — Z3041 Encounter for surveillance of contraceptive pills: Secondary | ICD-10-CM

## 2020-08-27 MED ORDER — NORETHIN ACE-ETH ESTRAD-FE 1-20 MG-MCG(24) PO TABS: 1.0000 | ORAL_TABLET | Freq: Every day | ORAL | 0 refills | Status: DC

## 2020-08-27 NOTE — Progress Notes (Signed)
Not able to make it to well-woman visit on time due to transportation issues. Rx sent until patient able to get re-scheduled for well-woman visit.  Raelyn Mora, CNM

## 2020-08-28 ENCOUNTER — Other Ambulatory Visit: Payer: Self-pay | Admitting: Obstetrics and Gynecology

## 2020-08-29 ENCOUNTER — Telehealth: Payer: Self-pay | Admitting: *Deleted

## 2020-08-29 NOTE — Telephone Encounter (Signed)
Pt left VM message stating that her birth control prescription is still not ready @ her pharmacy. I called and spoke w/pharmacy employee @ [facility] and was told that they are holding the Rx because Medicaid has advised that pt has other insurance and they do not have that information. Once pt provides her primary insurance information, they will be able to release the Rx. I called pt this morning and provided this information. She stated that her preferred pharmacy is [facility] on [address] and they have all of her insurance information on file. I advised pt to contact [facility] and request the Rx to be transferred. Pt voiced understanding. Pharmacy preference was updated in pt's file.

## 2020-09-09 ENCOUNTER — Ambulatory Visit: Payer: Medicaid Other | Admitting: Obstetrics & Gynecology

## 2020-09-18 ENCOUNTER — Other Ambulatory Visit: Payer: Self-pay | Admitting: Obstetrics and Gynecology

## 2020-09-19 ENCOUNTER — Other Ambulatory Visit: Payer: Self-pay | Admitting: *Deleted

## 2020-09-19 ENCOUNTER — Encounter: Payer: Self-pay | Admitting: *Deleted

## 2020-09-19 DIAGNOSIS — Z3041 Encounter for surveillance of contraceptive pills: Secondary | ICD-10-CM

## 2020-09-19 MED ORDER — NORETHIN ACE-ETH ESTRAD-FE 1-20 MG-MCG(24) PO TABS: 1.0000 | ORAL_TABLET | Freq: Every day | ORAL | 0 refills | Status: DC

## 2020-10-01 ENCOUNTER — Ambulatory Visit: Payer: BC Managed Care – PPO | Admitting: Nurse Practitioner

## 2020-10-01 ENCOUNTER — Other Ambulatory Visit: Payer: Self-pay

## 2020-10-01 ENCOUNTER — Ambulatory Visit (INDEPENDENT_AMBULATORY_CARE_PROVIDER_SITE_OTHER): Payer: BC Managed Care – PPO | Admitting: Nurse Practitioner

## 2020-10-01 DIAGNOSIS — Z3041 Encounter for surveillance of contraceptive pills: Secondary | ICD-10-CM

## 2020-10-01 MED ORDER — NORETHIN ACE-ETH ESTRAD-FE 1-20 MG-MCG(24) PO TABS: 1.0000 | ORAL_TABLET | Freq: Every day | ORAL | 2 refills | Status: DC

## 2020-10-01 NOTE — Progress Notes (Signed)
11:25 I called Katherine Cooley to come in for her visit. She informed me she cannot because she has to be at work at 87 and has been waiting for an hour for her appointment that was scheduled for 10:15.  She asked if we could send her birth control pills in. I discussed with Earlie Server, NP and she approved 3 months due to circumstances. I apologized for the delay.  I explained since we were running late and she wasn't able to stay we can send in prescription for 3 months but she will need to be rescheduled and registrar will contact her with date. Jacques Navy

## 2020-10-02 ENCOUNTER — Ambulatory Visit (INDEPENDENT_AMBULATORY_CARE_PROVIDER_SITE_OTHER): Payer: BC Managed Care – PPO | Admitting: Obstetrics and Gynecology

## 2020-10-02 ENCOUNTER — Ambulatory Visit: Payer: BC Managed Care – PPO | Admitting: Obstetrics and Gynecology

## 2020-10-02 ENCOUNTER — Encounter: Payer: Self-pay | Admitting: Obstetrics and Gynecology

## 2020-10-02 ENCOUNTER — Other Ambulatory Visit (HOSPITAL_COMMUNITY)
Admission: RE | Admit: 2020-10-02 | Discharge: 2020-10-02 | Disposition: A | Discharge status: Home / Self Care | Payer: BC Managed Care – PPO | Source: Ambulatory Visit | Attending: Obstetrics and Gynecology | Admitting: Obstetrics and Gynecology

## 2020-10-02 VITALS — BP 157/95 | HR 91 | Ht 66.0 in | Wt 137.0 lb

## 2020-10-02 DIAGNOSIS — N83291 Other ovarian cyst, right side: Secondary | ICD-10-CM

## 2020-10-02 DIAGNOSIS — Z131 Encounter for screening for diabetes mellitus: Secondary | ICD-10-CM

## 2020-10-02 DIAGNOSIS — Z01419 Encounter for gynecological examination (general) (routine) without abnormal findings: Secondary | ICD-10-CM

## 2020-10-02 DIAGNOSIS — I10 Essential (primary) hypertension: Secondary | ICD-10-CM

## 2020-10-02 DIAGNOSIS — Z113 Encounter for screening for infections with a predominantly sexual mode of transmission: Secondary | ICD-10-CM | POA: Diagnosis not present

## 2020-10-02 DIAGNOSIS — Z13220 Encounter for screening for lipoid disorders: Secondary | ICD-10-CM

## 2020-10-02 DIAGNOSIS — Z114 Encounter for screening for human immunodeficiency virus [HIV]: Secondary | ICD-10-CM | POA: Diagnosis not present

## 2020-10-02 MED ORDER — AMLODIPINE BESYLATE 10 MG PO TABS: 10.0000 mg | ORAL_TABLET | Freq: Every day | ORAL | 2 refills | Status: DC

## 2020-10-02 MED ORDER — NAPROXEN 500 MG PO TABS: 500.0000 mg | ORAL_TABLET | Freq: Two times a day (BID) | ORAL | 2 refills | Status: DC

## 2020-10-02 NOTE — Progress Notes (Signed)
   History:  [name] is a 40 y.o. [id number] who presents to clinic today for annual exam.    Cyst on ovary very painful intermittently. Tylenol doesn't help. Had CT scan that showed bilateral ~4cm masses on ovaries, consistent w dermoid cyst.  -on Loestrin birth control pills.  -pain not associated with menstrual cycle. Has periods of sharp pain that makes her go into fetal position  -period lasts four days  Sexually active, same partner, monogamous.   Elevated blood pressure -  Has strong family history of high BP. Has had high BP per chart review for several years. No chest pain.   -Exercises a few times a week.    The following portions of the patient's history were reviewed and updated as appropriate: allergies, current medications, family history, past medical history, social history, past surgical history and problem list.  Review of Systems:  Review of Systems  Constitutional: Negative for chills and fever.  Cardiovascular: Negative for chest pain.  Gastrointestinal: Negative for abdominal pain, nausea and vomiting.  Musculoskeletal: Negative for back pain.  Neurological: Negative for dizziness and headaches.  Psychiatric/Behavioral: Negative for depression.      Objective:  Physical Exam BP (!) 157/95   Pulse 91   Ht 5\' 6"  (1.676 m)   Wt 137 lb (62.1 kg)   LMP 09/28/2020   Breastfeeding No   BMI 22.11 kg/m  Physical Exam Vitals and nursing note reviewed. Exam conducted with a chaperone present.  Constitutional:      Appearance: Normal appearance.  Cardiovascular:     Rate and Rhythm: Normal rate and regular rhythm.     Heart sounds: No murmur heard.   Pulmonary:     Effort: Pulmonary effort is normal.     Breath sounds: Normal breath sounds.  Abdominal:     Palpations: Abdomen is soft.  Genitourinary:    General: Normal vulva.     Vagina: No vaginal discharge.  Skin:    General: Skin is warm and dry.  Neurological:     Mental Status: She is  alert.  Psychiatric:        Mood and Affect: Mood normal.        Behavior: Behavior normal.       Labs and Imaging No results found for this or any previous visit (from the past 24 hour(s)).  No results found.   Assessment & Plan:   Annual Gyn Exam- Last pap NILM in 2020, not due. STD testing performed. Routine health maintenance screenings performed.   Essential HTN- strong family hx and chart review demonstrates elevated BP for several years. Counseled on exercise and lifestyle modificiations. Will start norvasc 10mg  and patient plans to find PCP. Discussed indications, risks, benefits of med and she verbalizes understanding. Will f/u w me in 4-6 weeks or PCP.   Dermoid Cyst - counseled on signs/symptoms of ovarian torsion and when to seek urgent evaluation. Continue loestrin. Can trial NSAID for use prn for pain.   Janet Berlin, MD 10/02/2020 8:51 AM    Sharene Skeans, MD [facility] Family Medicine Fellow, [facility] for [facility], [place]

## 2020-10-02 NOTE — Patient Instructions (Signed)

## 2020-10-03 LAB — CERVICOVAGINAL ANCILLARY ONLY
Chlamydia: NEGATIVE
Comment: NEGATIVE
Comment: NEGATIVE
Comment: NORMAL
Neisseria Gonorrhea: NEGATIVE
Trichomonas: NEGATIVE

## 2020-10-03 LAB — LIPID PANEL
Chol/HDL Ratio: 1.9 ratio (ref 0.0–4.4)
Cholesterol, Total: 153 mg/dL (ref 100–199)
HDL: 82 mg/dL (ref >39)
LDL Chol Calc (NIH): 61 mg/dL (ref 0–99)
Triglycerides: 46 mg/dL (ref 0–149)
VLDL Cholesterol Cal: 10 mg/dL (ref 5–40)

## 2020-10-03 LAB — HEPATITIS C ANTIBODY: Hep C Virus Ab: 0.1 s/co ratio (ref 0.0–0.9)

## 2020-10-03 LAB — HEMOGLOBIN A1C
Est. average glucose Bld gHb Est-mCnc: 111 mg/dL
Hgb A1c MFr Bld: 5.5 % (ref 4.8–5.6)

## 2020-10-03 LAB — RPR: RPR Ser Ql: NONREACTIVE

## 2020-10-03 LAB — HIV ANTIBODY (ROUTINE TESTING W REFLEX): HIV Screen 4th Generation wRfx: NONREACTIVE

## 2020-10-03 LAB — HEPATITIS B SURFACE ANTIGEN: Hepatitis B Surface Ag: NEGATIVE

## 2020-10-11 ENCOUNTER — Telehealth: Payer: Self-pay | Admitting: *Deleted

## 2020-10-11 NOTE — Telephone Encounter (Signed)
Katherine Cooley left a voicemail today stating she came in to office a week or two ago. States the doctor prescribed a blood pressure medicine. States the first few days it made her feel awful.States she does have a heart murmur and sometimes it makes her heart speed up.  States she felt that, and seeing stars. States she stopped taking the medicine. States she prefers to not take medicine but prefers to change her diet, exercise.  Would like a call back about what to do. I called [name] back and advised her I cannot change her orders and Dr. Berniece Andreas is not here. I advised her I will send a message to Dr Berniece Andreas to see if she would like to order a different medicine. I explained we are not primary care so we do not mange blood pressure but I think the doctor was trying to give her a medicine to lower her blood pressure because of risk of stroke/ heart attack due to untreated high blood pressure. Then she would follow up with PCP.  I advised her it is good to eat a heart healthy diet and losing weight can make a difference but since she has high blood pressure for several years that the doctor felt she needed medicine. I advised her to go to the hospital if she has severe headache unrelieved by tylenol, SOB or other severe symptoms . She voices understanding.  [name],RN

## 2020-10-16 ENCOUNTER — Other Ambulatory Visit: Payer: Self-pay | Admitting: Obstetrics and Gynecology

## 2020-10-16 NOTE — Progress Notes (Unsigned)
Agree with patient discontinuing medication given her symptoms. It is important that she establish care with a PCP as soon as possible to discuss blood pressure issues. This has been a long-standing issue for patient though, and so it would be okay if she wishes to wait until she establishes care with a family doctor before starting a new blood pressure medication.   Sharene Skeans, MD [facility] Family Medicine Fellow, [facility] for [facility], [place]

## 2020-10-17 NOTE — Telephone Encounter (Signed)
Received message from Dr. Marny Lowenstein. I called Katherine Cooley and notified her per Dr. Berniece Andreas she agrees with her stopping the blood pressure medication due to her symptoms. We also discussed she recommends she start care with a PCP or Family Doctor asap to follow her blood pressure and decide if needs to start a blood pressure medicine. She states she has gotten insurance now and is deciding on a PCP and will make appointment. I advised if she has a severe headache could indicate dangerously high blood pressure and she needs to get it checked at ER or Urgent care if that happens. She voices understanding. Traxton Kolenda,RN

## 2020-11-13 ENCOUNTER — Other Ambulatory Visit: Payer: BC Managed Care – PPO

## 2020-11-13 ENCOUNTER — Ambulatory Visit: Payer: BC Managed Care – PPO

## 2020-11-22 ENCOUNTER — Other Ambulatory Visit: Payer: Self-pay | Admitting: Nurse Practitioner

## 2020-11-22 DIAGNOSIS — Z3041 Encounter for surveillance of contraceptive pills: Secondary | ICD-10-CM

## 2021-01-23 ENCOUNTER — Telehealth: Payer: Self-pay | Admitting: *Deleted

## 2021-01-23 NOTE — Telephone Encounter (Signed)
Katherine Cooley left a voice message this pm that she needs refill of her birth control pills. States she called in March about refill . States she saw a message about her needing to find a doctor for her blood pressure before get refill. States she doesn't  Have a provider. States she saw provider in our office so she can get birth control pills. Wants to know what she needs to do so she can get birth control pills. [name],RN

## 2021-01-24 NOTE — Telephone Encounter (Signed)
Returned patients call. Patient has not kept follow up appointment for BP due to job schedules.   Reviewed with patient that it is not recommended that we refill her OCP's without verification of her BP in the case that OCP's causes increased BP.   Patient reports she is working to find a PCP and she is considering coming off the OCP's. She reports she has a name of a provider to call to schedule.   Reviewed we would be glad to have her come to the office for BP check or virtual visit to discuss other birth control options. Patient voiced understanding.

## 2021-02-06 ENCOUNTER — Ambulatory Visit (INDEPENDENT_AMBULATORY_CARE_PROVIDER_SITE_OTHER): Payer: BC Managed Care – PPO | Admitting: Nurse Practitioner

## 2021-02-06 ENCOUNTER — Other Ambulatory Visit: Payer: Self-pay

## 2021-02-06 ENCOUNTER — Encounter: Payer: Self-pay | Admitting: Nurse Practitioner

## 2021-02-06 VITALS — BP 126/78 | HR 74 | Temp 98.8°F | Ht 66.0 in | Wt 142.2 lb

## 2021-02-06 DIAGNOSIS — R079 Chest pain, unspecified: Secondary | ICD-10-CM

## 2021-02-06 DIAGNOSIS — Z7689 Persons encountering health services in other specified circumstances: Secondary | ICD-10-CM | POA: Diagnosis not present

## 2021-02-06 DIAGNOSIS — F439 Reaction to severe stress, unspecified: Secondary | ICD-10-CM

## 2021-02-06 NOTE — Patient Instructions (Signed)
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
## 2021-02-17 ENCOUNTER — Telehealth: Payer: Self-pay

## 2021-02-17 NOTE — Telephone Encounter (Signed)
Lvm for pt to call.  

## 2021-02-19 ENCOUNTER — Telehealth: Payer: Self-pay | Admitting: *Deleted

## 2021-02-19 DIAGNOSIS — Z3041 Encounter for surveillance of contraceptive pills: Secondary | ICD-10-CM

## 2021-02-19 MED ORDER — NORETHINDRONE 0.35 MG PO TABS
1.0000 | ORAL_TABLET | Freq: Every day | ORAL | 11 refills | Status: DC
Start: 2021-02-19 — End: 2021-04-07

## 2021-02-19 NOTE — Telephone Encounter (Signed)
Patient left a voice message this am she has been waiting since last week to get birth control refilled. States she was talking with someone on chat and they said would call or message on Friday by noon, but did not and now is Wednesday.  States she is bleeding now heavy x 2 weeks and needs her medicine. States she got her bp checked and is ok. Thomasenia Dowse,RN

## 2021-02-19 NOTE — Telephone Encounter (Signed)
Per chart saw Dr. Berniece Andreas in February and started on Norvasc for elevated bp. Patient stopped soon after due to made her feel bad. Per chart was told to follow up with PCP which she did and bp there wnl x1.  I called patient to discuss her request for ocp refill and c/o bleeding. She states she is bleeding like a period for 2 weeks and just wants her pills refilled so her bleeding will stop . States she went to PCP as requested and just needs her pills and has to get back to work. I explained Dr. Berniece Andreas not in office but I will tak with another provider . She prefers I sent her MYChart message with response since she is working.  I Reviewed patient history with Earlie Server, NP and request for refill loestrin. She declines refill of Loestrin but may have Micronor since it does not have Estrogen. States if this does not work, she will need to make appointment in our office with MD and bp check .  I sent RX in and MyChart message to patient. Beckie Viscardi,RN

## 2021-02-20 ENCOUNTER — Other Ambulatory Visit: Payer: Self-pay | Admitting: Obstetrics and Gynecology

## 2021-02-20 DIAGNOSIS — Z3041 Encounter for surveillance of contraceptive pills: Secondary | ICD-10-CM

## 2021-03-11 ENCOUNTER — Encounter: Payer: BC Managed Care – PPO | Admitting: Nurse Practitioner

## 2021-03-26 ENCOUNTER — Ambulatory Visit (INDEPENDENT_AMBULATORY_CARE_PROVIDER_SITE_OTHER): Payer: BC Managed Care – PPO | Admitting: Nurse Practitioner

## 2021-03-26 ENCOUNTER — Other Ambulatory Visit: Payer: Self-pay

## 2021-03-26 ENCOUNTER — Encounter: Payer: Self-pay | Admitting: Nurse Practitioner

## 2021-03-26 VITALS — BP 126/88 | HR 78 | Temp 98.4°F | Ht 66.0 in | Wt 141.6 lb

## 2021-03-26 DIAGNOSIS — R6889 Other general symptoms and signs: Secondary | ICD-10-CM | POA: Diagnosis not present

## 2021-03-26 DIAGNOSIS — Z0000 Encounter for general adult medical examination without abnormal findings: Secondary | ICD-10-CM

## 2021-03-26 DIAGNOSIS — E559 Vitamin D deficiency, unspecified: Secondary | ICD-10-CM

## 2021-03-26 NOTE — Patient Instructions (Signed)
Health Maintenance, Female Adopting a healthy lifestyle and getting preventive care are important in promoting health and wellness. Ask your health care provider about: The right schedule for you to have regular tests and exams. Things you can do on your own to prevent diseases and keep yourself healthy. What should I know about diet, weight, and exercise? Eat a healthy diet  Eat a diet that includes plenty of vegetables, fruits, low-fat dairy products, and lean protein. Do not eat a lot of foods that are high in solid fats, added sugars, or sodium.  Maintain a healthy weight Body mass index (BMI) is used to identify weight problems. It estimates body fat based on height and weight. Your health care provider can help determineyour BMI and help you achieve or maintain a healthy weight. Get regular exercise Get regular exercise. This is one of the most important things you can do for your health. Most adults should: Exercise for at least 150 minutes each week. The exercise should increase your heart rate and make you sweat (moderate-intensity exercise). Do strengthening exercises at least twice a week. This is in addition to the moderate-intensity exercise. Spend less time sitting. Even light physical activity can be beneficial. Watch cholesterol and blood lipids Have your blood tested for lipids and cholesterol at 39 years of age, then havethis test every 5 years. Have your cholesterol levels checked more often if: Your lipid or cholesterol levels are high. You are older than 40 years of age. You are at high risk for heart disease. What should I know about cancer screening? Depending on your health history and family history, you may need to have cancer screening at various ages. This may include screening for: Breast cancer. Cervical cancer. Colorectal cancer. Skin cancer. Lung cancer. What should I know about heart disease, diabetes, and high blood pressure? Blood pressure and heart  disease High blood pressure causes heart disease and increases the risk of stroke. This is more likely to develop in people who have high blood pressure readings, are of African descent, or are overweight. Have your blood pressure checked: Every 3-5 years if you are 18-39 years of age. Every year if you are 40 years old or older. Diabetes Have regular diabetes screenings. This checks your fasting blood sugar level. Have the screening done: Once every three years after age 40 if you are at a normal weight and have a low risk for diabetes. More often and at a younger age if you are overweight or have a high risk for diabetes. What should I know about preventing infection? Hepatitis B If you have a higher risk for hepatitis B, you should be screened for this virus. Talk with your health care provider to find out if you are at risk forhepatitis B infection. Hepatitis C Testing is recommended for: Everyone born from 1945 through 1965. Anyone with known risk factors for hepatitis C. Sexually transmitted infections (STIs) Get screened for STIs, including gonorrhea and chlamydia, if: You are sexually active and are younger than 39 years of age. You are older than 39 years of age and your health care provider tells you that you are at risk for this type of infection. Your sexual activity has changed since you were last screened, and you are at increased risk for chlamydia or gonorrhea. Ask your health care provider if you are at risk. Ask your health care provider about whether you are at high risk for HIV. Your health care provider may recommend a prescription medicine to help   prevent HIV infection. If you choose to take medicine to prevent HIV, you should first get tested for HIV. You should then be tested every 3 months for as long as you are taking the medicine. Pregnancy If you are about to stop having your period (premenopausal) and you may become pregnant, seek counseling before you get  pregnant. Take 400 to 800 micrograms (mcg) of folic acid every day if you become pregnant. Ask for birth control (contraception) if you want to prevent pregnancy. Osteoporosis and menopause Osteoporosis is a disease in which the bones lose minerals and strength with aging. This can result in bone fractures. If you are 65 years old or older, or if you are at risk for osteoporosis and fractures, ask your health care provider if you should: Be screened for bone loss. Take a calcium or vitamin D supplement to lower your risk of fractures. Be given hormone replacement therapy (HRT) to treat symptoms of menopause. Follow these instructions at home: Lifestyle Do not use any products that contain nicotine or tobacco, such as cigarettes, e-cigarettes, and chewing tobacco. If you need help quitting, ask your health care provider. Do not use street drugs. Do not share needles. Ask your health care provider for help if you need support or information about quitting drugs. Alcohol use Do not drink alcohol if: Your health care provider tells you not to drink. You are pregnant, may be pregnant, or are planning to become pregnant. If you drink alcohol: Limit how much you use to 0-1 drink a day. Limit intake if you are breastfeeding. Be aware of how much alcohol is in your drink. In the [place] one drink equals one 12 oz bottle of beer (355 mL), one 5 oz glass of wine (148 mL), or one 1 oz glass of hard liquor (44 mL). General instructions Schedule regular health, dental, and eye exams. Stay current with your vaccines. Tell your health care provider if: You often feel depressed. You have ever been abused or do not feel safe at home. Summary Adopting a healthy lifestyle and getting preventive care are important in promoting health and wellness. Follow your health care provider's instructions about healthy diet, exercising, and getting tested or screened for diseases. Follow your health care provider's  instructions on monitoring your cholesterol and blood pressure. This information is not intended to replace advice given to you by your health care provider. Make sure you discuss any questions you have with your healthcare provider. Document Revised: 08/10/2018 Document Reviewed: 08/10/2018 Elsevier Patient Education  2022 Elsevier Inc.  
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
## 2021-03-27 ENCOUNTER — Other Ambulatory Visit: Payer: Self-pay | Admitting: Nurse Practitioner

## 2021-03-27 DIAGNOSIS — E559 Vitamin D deficiency, unspecified: Secondary | ICD-10-CM

## 2021-03-27 LAB — CMP14+EGFR
ALT: 27 IU/L (ref 0–32)
AST: 30 IU/L (ref 0–40)
Albumin/Globulin Ratio: 1.9 (ref 1.2–2.2)
Albumin: 4.7 g/dL (ref 3.8–4.8)
Alkaline Phosphatase: 42 IU/L — ABNORMAL LOW (ref 44–121)
BUN/Creatinine Ratio: 9 (ref 9–23)
BUN: 8 mg/dL (ref 6–20)
Bilirubin Total: 0.4 mg/dL (ref 0.0–1.2)
CO2: 27 mmol/L (ref 20–29)
Calcium: 9.4 mg/dL (ref 8.7–10.2)
Chloride: 98 mmol/L (ref 96–106)
Creatinine, Ser: 0.86 mg/dL (ref 0.57–1.00)
Globulin, Total: 2.5 g/dL (ref 1.5–4.5)
Glucose: 93 mg/dL (ref 65–99)
Potassium: 3.9 mmol/L (ref 3.5–5.2)
Sodium: 137 mmol/L (ref 134–144)
Total Protein: 7.2 g/dL (ref 6.0–8.5)
eGFR: 88 mL/min/{1.73_m2} (ref >59)

## 2021-03-27 LAB — CBC
Hematocrit: 38.1 % (ref 34.0–46.6)
Hemoglobin: 13 g/dL (ref 11.1–15.9)
MCH: 31 pg (ref 26.6–33.0)
MCHC: 34.1 g/dL (ref 31.5–35.7)
MCV: 91 fL (ref 79–97)
Platelets: 281 10*3/uL (ref 150–450)
RBC: 4.19 x10E6/uL (ref 3.77–5.28)
RDW: 12.9 % (ref 11.7–15.4)
WBC: 3.8 10*3/uL (ref 3.4–10.8)

## 2021-03-27 LAB — LIPID PANEL
Chol/HDL Ratio: 2 ratio (ref 0.0–4.4)
Cholesterol, Total: 146 mg/dL (ref 100–199)
HDL: 72 mg/dL (ref >39)
LDL Chol Calc (NIH): 64 mg/dL (ref 0–99)
Triglycerides: 46 mg/dL (ref 0–149)
VLDL Cholesterol Cal: 10 mg/dL (ref 5–40)

## 2021-03-27 LAB — VITAMIN D 25 HYDROXY (VIT D DEFICIENCY, FRACTURES): Vit D, 25-Hydroxy: 26 ng/mL — ABNORMAL LOW (ref 30.0–100.0)

## 2021-03-27 LAB — TSH+FREE T4
Free T4: 1.15 ng/dL (ref 0.82–1.77)
TSH: 0.496 u[IU]/mL (ref 0.450–4.500)

## 2021-03-27 LAB — HEMOGLOBIN A1C
Est. average glucose Bld gHb Est-mCnc: 111 mg/dL
Hgb A1c MFr Bld: 5.5 % (ref 4.8–5.6)

## 2021-03-27 MED ORDER — VITAMIN D (ERGOCALCIFEROL) 1.25 MG (50000 UNIT) PO CAPS: 50000.0000 [IU] | ORAL_CAPSULE | ORAL | 0 refills | Status: DC

## 2021-03-28 ENCOUNTER — Telehealth: Payer: Self-pay

## 2021-03-28 NOTE — Telephone Encounter (Signed)
Left the patient a message to call back for lab results or view them on to her mychart

## 2021-03-28 NOTE — Telephone Encounter (Signed)
-----   Message from Bary Castilla, NP sent at 03/27/2021  3:41 PM EDT ----- Your alkaline phosphatase is at 40, better than last time. Slightly low. Try taking some zinc to help with that. Your kidney an liver functions are stable. Your blood count is stable. Your lipid panel is normal. Your Vit D is low at 26. I will send a prescription to take once weekly. Thyroid levels are normal.

## 2021-04-07 ENCOUNTER — Other Ambulatory Visit: Payer: Self-pay

## 2021-04-07 ENCOUNTER — Ambulatory Visit (INDEPENDENT_AMBULATORY_CARE_PROVIDER_SITE_OTHER): Payer: BC Managed Care – PPO | Admitting: Obstetrics and Gynecology

## 2021-04-07 DIAGNOSIS — Z3009 Encounter for other general counseling and advice on contraception: Secondary | ICD-10-CM | POA: Insufficient documentation

## 2021-04-07 HISTORY — DX: Encounter for other general counseling and advice on contraception: Z30.09

## 2021-04-07 MED ORDER — ETONOGESTREL-ETHINYL ESTRADIOL 0.12-0.015 MG/24HR VA RING: VAGINAL_RING | VAGINAL | 12 refills | Status: DC

## 2021-04-07 NOTE — Progress Notes (Signed)
  CC: birth control management Subjective:    Patient ID: Katherine Cooley, female    DOB: 1981/06/23, 40 y.o.   MRN: [id number]  HPI 40 yo G3P2 seen for discussion of birth control change.  Pt is on progesterone only pills because she has had hypertension on previous visits.  MD did not want to exacerbate hypertension with estrogen/ progesterone pill.  Pt is unhappy with POP due to lack of bleeding control.  Pt educated that traditional birth control may make her blood pressure worse.  Pt did note when she goes to her PCP that her blood pressure is normal .  Discussed alternatives.  IUD and nexplanon or depo provera would be decent alternative.  Did discuss nuvaring as estrogen containing device that will work like an OCP, but may not affect blood pressure as much.   Review of Systems     Objective:   Physical Exam Vitals:   04/07/21 1434 04/07/21 1447  BP: (!) 165/88 (!) 159/99  Pulse:  78         Assessment & Plan:   1. Birth control counseling Trial of nuvaring.  Pt given new start info as well as info on how to place device.  BP check in 5 weeks.  If blood pressure significantly elevate, she will need another birth control method. - etonogestrel-ethinyl estradiol (NUVARING) 0.12-0.015 MG/24HR vaginal ring; Insert vaginally and leave in place for 3 consecutive weeks, then remove for 1 week.  Dispense: 1 each; Refill: 12  I spent 10 minutes dedicated to the care of this patient including previsit review of records, face to face time with the patient discussing treatment options and post visit testing.   Griffin Basil, MD Faculty Attending, [facility]

## 2021-05-15 ENCOUNTER — Ambulatory Visit: Payer: BC Managed Care – PPO | Admitting: Obstetrics and Gynecology

## 2021-05-23 ENCOUNTER — Other Ambulatory Visit: Payer: Self-pay | Admitting: Nurse Practitioner

## 2021-05-23 DIAGNOSIS — E559 Vitamin D deficiency, unspecified: Secondary | ICD-10-CM

## 2021-05-26 ENCOUNTER — Ambulatory Visit: Payer: BC Managed Care – PPO | Admitting: Obstetrics and Gynecology

## 2022-02-21 IMAGING — US US PELVIS COMPLETE TRANSABD/TRANSVAG W DUPLEX
1 series · 13 of 25 positions shown · non-contrast
Comparison: Prior CT from earlier same day as well as previous
ultrasound from 05/02/2017

CLINICAL DATA: Initial evaluation for acute pain.

EXAM:
TRANSABDOMINAL AND TRANSVAGINAL ULTRASOUND OF PELVIS
DOPPLER ULTRASOUND OF OVARIES
TECHNIQUE: Both transabdominal and transvaginal ultrasound examinations of the
pelvis were performed. Transabdominal technique was performed for
global imaging of the pelvis including uterus, ovaries, adnexal
regions, and pelvic cul-de-sac.
It was necessary to proceed with endovaginal exam following the
transabdominal exam to visualize the uterus, endometrium, and
ovaries. Color and duplex Doppler ultrasound was utilized to
evaluate blood flow to the ovaries.

[Series 1: us pelvis complete transabd/transvag w duplex · 116 acquisitions, 13 frames shown]
[im 1/116]
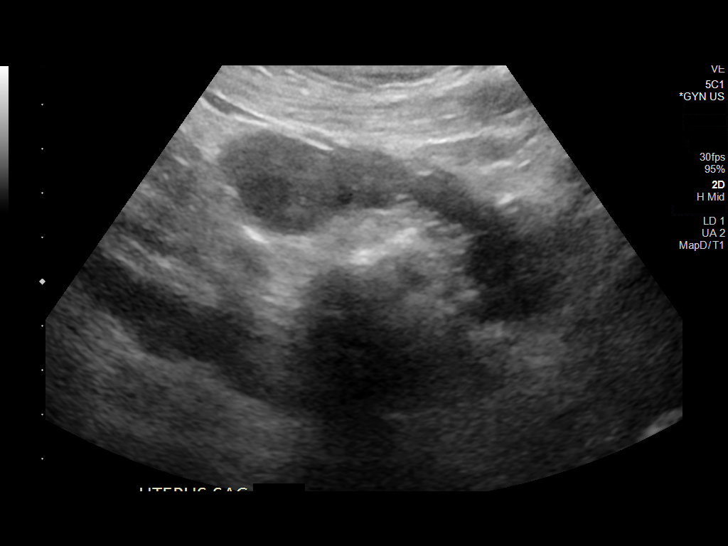
[im 10/116]
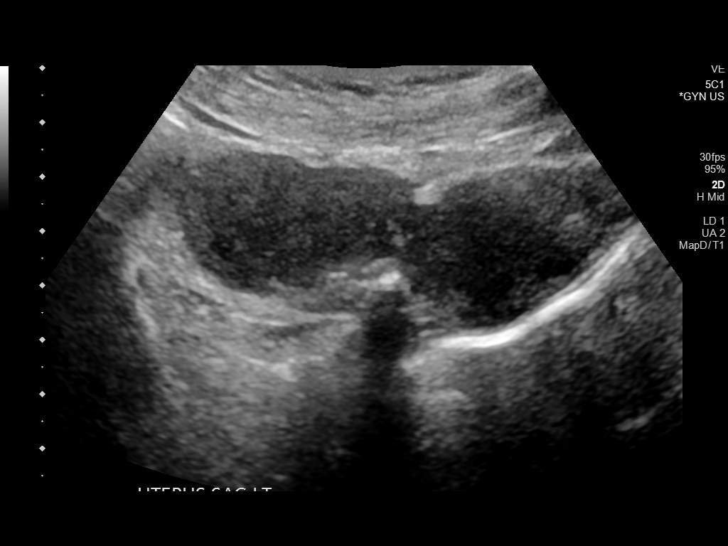
[im 20/116]
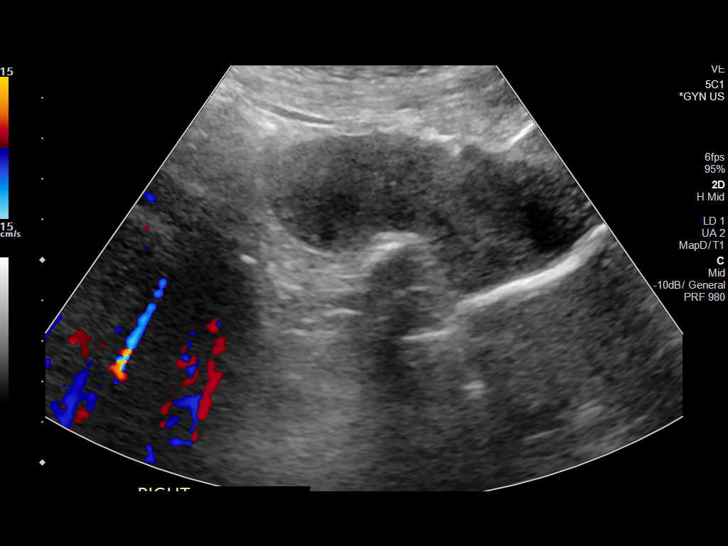
[im 29/116]
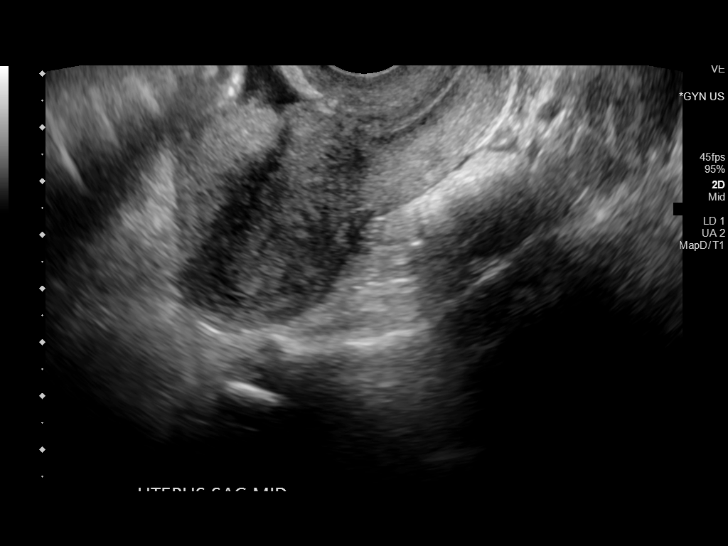
[im 39/116]
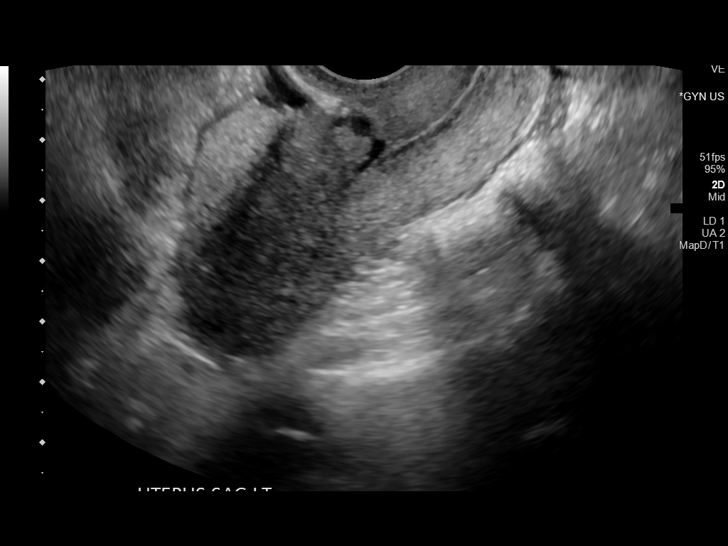
[im 48/116]
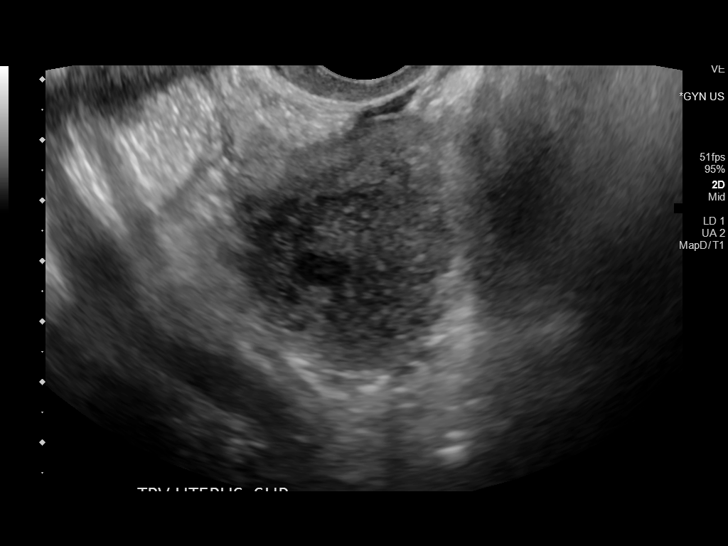
[im 58/116]
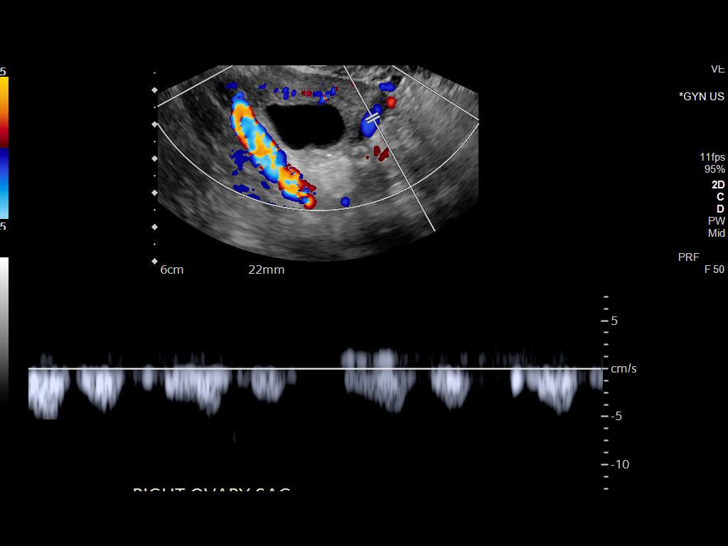
[im 68/116]
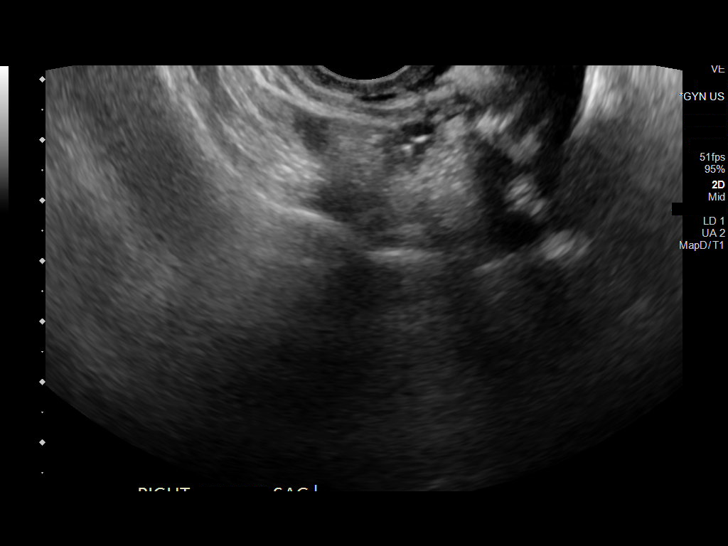
[im 77/116]
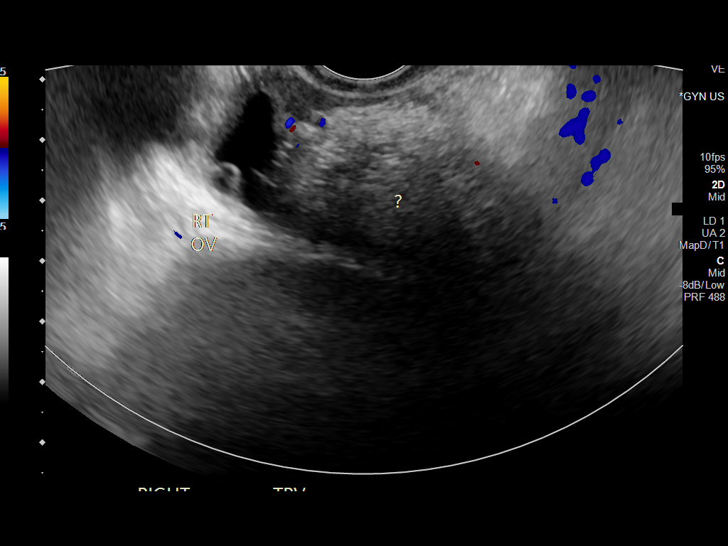
[im 87/116]
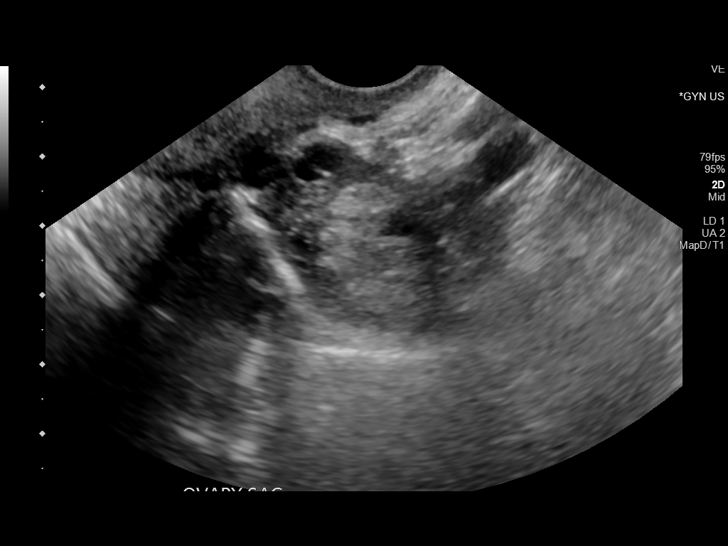
[im 96/116]
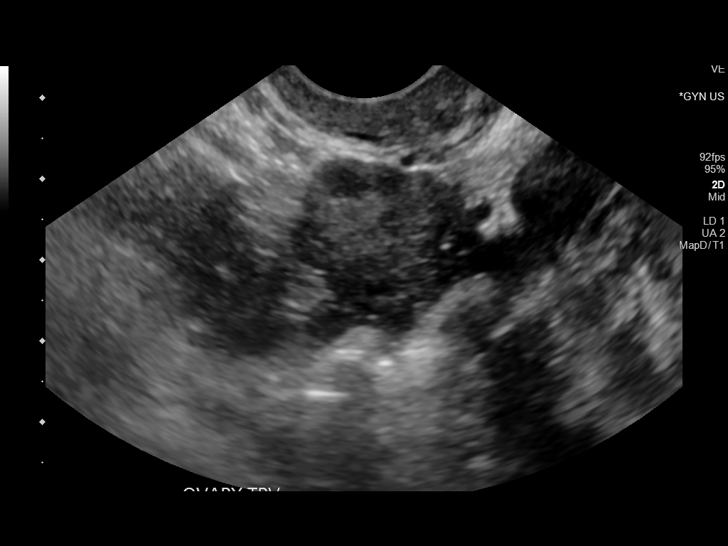
[im 106/116]
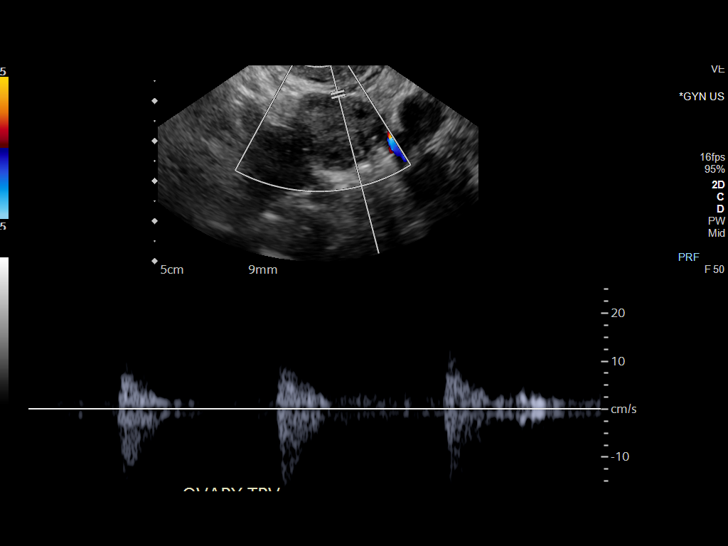
[im 116/116]
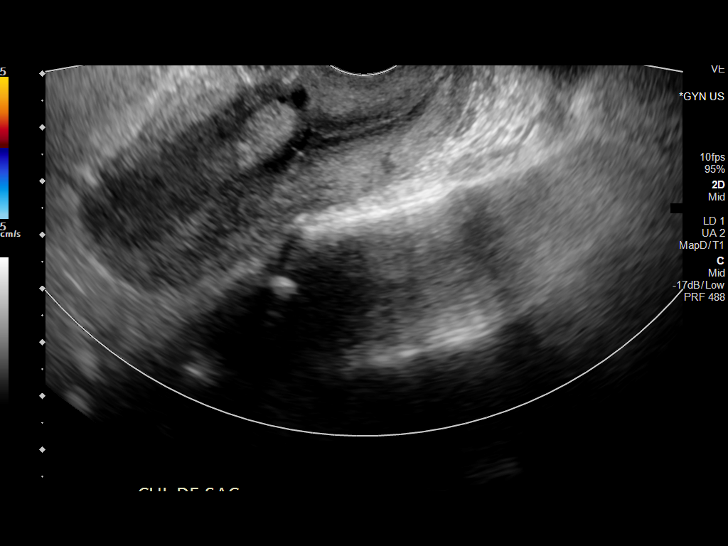

[13 of 25 positions shown; findings below may reference images not displayed]

FINDINGS: Uterus

Measurements: 7.8 x 3.6 x 4.5 cm = volume: 66 mL. Uterus is
anteverted. 1.4 x 1.3 x 1.7 cm intramural fibroid present at the
uterine fundus.

Endometrium

Thickness: 4 mm.  No focal abnormality visualized.

Right ovary

Measurements: 4.2 x 2.9 x 3.4 cm = volume: 22 mL. Heterogeneous mass
with prominent echogenic component measuring 4.8 x 3.4 x 4.4 cm seen
arising from the right ovary, consistent with an ovarian dermoid,
corresponding with abnormality on prior CT. An adjacent small
physiologic follicular cyst with internal daughter cyst noted as
well.

Left ovary

Measurements: 3.6 x 3.2 x 2.7 cm = volume: 16 mL. 2.1 x 1.6 x 1.4 cm
echogenic mass within the left ovary is seen, also consistent with
an ovarian dermoid. This is likely interval changed in size from
previous ultrasound from 4530.

Pulsed Doppler evaluation of both ovaries demonstrates normal
low-resistance arterial and venous waveforms.

Other findings

No abnormal free fluid.
IMPRESSION: 1. Bilateral ovarian masses, measuring up to 4.8 cm on the right and
2.1 cm on the left, consistent with ovarian dermoids. No evidence
for associated torsion.
2. 1.7 cm intramural fibroid at the uterine fundus.
3. No other acute abnormality within the pelvis.

## 2022-04-01 ENCOUNTER — Encounter: Payer: BC Managed Care – PPO | Admitting: Nurse Practitioner

## 2022-08-11 ENCOUNTER — Encounter: Payer: Medicaid Other | Admitting: Nurse Practitioner
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
## 2022-08-25 NOTE — Progress Notes (Signed)
Not seen

## 2022-12-30 LAB — OB RESULTS CONSOLE HIV ANTIBODY (ROUTINE TESTING): HIV: NONREACTIVE

## 2022-12-30 LAB — OB RESULTS CONSOLE ANTIBODY SCREEN: Antibody Screen: NEGATIVE

## 2022-12-30 LAB — OB RESULTS CONSOLE HEPATITIS B SURFACE ANTIGEN: Hepatitis B Surface Ag: NEGATIVE

## 2022-12-30 LAB — HEPATITIS C ANTIBODY: HCV Ab: NEGATIVE

## 2022-12-30 LAB — OB RESULTS CONSOLE RPR: RPR: NONREACTIVE

## 2022-12-30 LAB — OB RESULTS CONSOLE RUBELLA ANTIBODY, IGM: Rubella: IMMUNE

## 2023-01-01 ENCOUNTER — Other Ambulatory Visit: Payer: Self-pay | Admitting: Obstetrics and Gynecology

## 2023-01-01 DIAGNOSIS — Z363 Encounter for antenatal screening for malformations: Secondary | ICD-10-CM

## 2023-01-06 ENCOUNTER — Other Ambulatory Visit: Payer: Self-pay

## 2023-01-14 ENCOUNTER — Ambulatory Visit: Payer: 59 | Admitting: Obstetrics and Gynecology

## 2023-02-03 LAB — OB RESULTS CONSOLE GC/CHLAMYDIA
Chlamydia: NEGATIVE
Neisseria Gonorrhea: NEGATIVE

## 2023-02-04 DIAGNOSIS — O10919 Unspecified pre-existing hypertension complicating pregnancy, unspecified trimester: Secondary | ICD-10-CM | POA: Insufficient documentation

## 2023-02-04 DIAGNOSIS — O0930 Supervision of pregnancy with insufficient antenatal care, unspecified trimester: Secondary | ICD-10-CM | POA: Insufficient documentation

## 2023-02-04 DIAGNOSIS — O09529 Supervision of elderly multigravida, unspecified trimester: Secondary | ICD-10-CM | POA: Insufficient documentation

## 2023-02-04 DIAGNOSIS — I87309 Chronic venous hypertension (idiopathic) without complications of unspecified lower extremity: Secondary | ICD-10-CM | POA: Insufficient documentation

## 2023-02-08 ENCOUNTER — Ambulatory Visit: Payer: 59 | Admitting: *Deleted

## 2023-02-08 ENCOUNTER — Other Ambulatory Visit: Payer: Self-pay | Admitting: *Deleted

## 2023-02-08 ENCOUNTER — Ambulatory Visit: Payer: 59 | Attending: Obstetrics and Gynecology

## 2023-02-08 ENCOUNTER — Encounter: Payer: Self-pay | Admitting: *Deleted

## 2023-02-08 VITALS — BP 114/62 | HR 70

## 2023-02-08 DIAGNOSIS — O0930 Supervision of pregnancy with insufficient antenatal care, unspecified trimester: Secondary | ICD-10-CM | POA: Diagnosis present

## 2023-02-08 DIAGNOSIS — O09522 Supervision of elderly multigravida, second trimester: Secondary | ICD-10-CM

## 2023-02-08 DIAGNOSIS — O09529 Supervision of elderly multigravida, unspecified trimester: Secondary | ICD-10-CM

## 2023-02-08 DIAGNOSIS — I87309 Chronic venous hypertension (idiopathic) without complications of unspecified lower extremity: Secondary | ICD-10-CM | POA: Diagnosis present

## 2023-02-08 DIAGNOSIS — Z363 Encounter for antenatal screening for malformations: Secondary | ICD-10-CM | POA: Insufficient documentation

## 2023-02-08 DIAGNOSIS — O0932 Supervision of pregnancy with insufficient antenatal care, second trimester: Secondary | ICD-10-CM

## 2023-03-19 ENCOUNTER — Ambulatory Visit (HOSPITAL_BASED_OUTPATIENT_CLINIC_OR_DEPARTMENT_OTHER): Payer: 59

## 2023-03-19 ENCOUNTER — Ambulatory Visit: Payer: 59 | Attending: Obstetrics | Admitting: *Deleted

## 2023-03-19 ENCOUNTER — Other Ambulatory Visit: Payer: Self-pay | Admitting: *Deleted

## 2023-03-19 VITALS — BP 125/64 | HR 84

## 2023-03-19 DIAGNOSIS — O09522 Supervision of elderly multigravida, second trimester: Secondary | ICD-10-CM | POA: Insufficient documentation

## 2023-03-19 DIAGNOSIS — O358XX0 Maternal care for other (suspected) fetal abnormality and damage, not applicable or unspecified: Secondary | ICD-10-CM | POA: Insufficient documentation

## 2023-03-19 DIAGNOSIS — O0932 Supervision of pregnancy with insufficient antenatal care, second trimester: Secondary | ICD-10-CM | POA: Insufficient documentation

## 2023-03-19 DIAGNOSIS — Z3A27 27 weeks gestation of pregnancy: Secondary | ICD-10-CM

## 2023-03-19 DIAGNOSIS — O99412 Diseases of the circulatory system complicating pregnancy, second trimester: Secondary | ICD-10-CM | POA: Diagnosis not present

## 2023-03-19 DIAGNOSIS — O3440 Maternal care for other abnormalities of cervix, unspecified trimester: Secondary | ICD-10-CM | POA: Insufficient documentation

## 2023-03-19 DIAGNOSIS — O34219 Maternal care for unspecified type scar from previous cesarean delivery: Secondary | ICD-10-CM | POA: Diagnosis not present

## 2023-03-19 DIAGNOSIS — I87309 Chronic venous hypertension (idiopathic) without complications of unspecified lower extremity: Secondary | ICD-10-CM

## 2023-03-19 DIAGNOSIS — O0930 Supervision of pregnancy with insufficient antenatal care, unspecified trimester: Secondary | ICD-10-CM

## 2023-03-19 DIAGNOSIS — O3443 Maternal care for other abnormalities of cervix, third trimester: Secondary | ICD-10-CM | POA: Diagnosis not present

## 2023-03-22 ENCOUNTER — Ambulatory Visit: Payer: 59

## 2023-04-09 ENCOUNTER — Other Ambulatory Visit: Payer: Self-pay

## 2023-04-19 ENCOUNTER — Other Ambulatory Visit: Payer: Self-pay

## 2023-04-19 DIAGNOSIS — O24419 Gestational diabetes mellitus in pregnancy, unspecified control: Secondary | ICD-10-CM | POA: Insufficient documentation

## 2023-04-21 ENCOUNTER — Other Ambulatory Visit: Payer: Self-pay | Admitting: *Deleted

## 2023-04-21 ENCOUNTER — Ambulatory Visit: Payer: Medicaid Other | Admitting: *Deleted

## 2023-04-21 ENCOUNTER — Encounter: Payer: Medicaid Other | Attending: Obstetrics and Gynecology | Admitting: Registered"

## 2023-04-21 ENCOUNTER — Ambulatory Visit: Payer: Medicaid Other | Attending: Obstetrics

## 2023-04-21 VITALS — BP 122/63 | HR 95

## 2023-04-21 DIAGNOSIS — O10013 Pre-existing essential hypertension complicating pregnancy, third trimester: Secondary | ICD-10-CM | POA: Diagnosis not present

## 2023-04-21 DIAGNOSIS — O09523 Supervision of elderly multigravida, third trimester: Secondary | ICD-10-CM

## 2023-04-21 DIAGNOSIS — O24419 Gestational diabetes mellitus in pregnancy, unspecified control: Secondary | ICD-10-CM | POA: Insufficient documentation

## 2023-04-21 DIAGNOSIS — Z9889 Other specified postprocedural states: Secondary | ICD-10-CM

## 2023-04-21 DIAGNOSIS — I87309 Chronic venous hypertension (idiopathic) without complications of unspecified lower extremity: Secondary | ICD-10-CM | POA: Insufficient documentation

## 2023-04-21 DIAGNOSIS — O3443 Maternal care for other abnormalities of cervix, third trimester: Secondary | ICD-10-CM

## 2023-04-21 DIAGNOSIS — O34219 Maternal care for unspecified type scar from previous cesarean delivery: Secondary | ICD-10-CM

## 2023-04-21 DIAGNOSIS — O09522 Supervision of elderly multigravida, second trimester: Secondary | ICD-10-CM | POA: Diagnosis present

## 2023-04-21 DIAGNOSIS — O24410 Gestational diabetes mellitus in pregnancy, diet controlled: Secondary | ICD-10-CM | POA: Diagnosis not present

## 2023-04-21 DIAGNOSIS — O0933 Supervision of pregnancy with insufficient antenatal care, third trimester: Secondary | ICD-10-CM

## 2023-04-21 DIAGNOSIS — Z3A32 32 weeks gestation of pregnancy: Secondary | ICD-10-CM

## 2023-04-21 DIAGNOSIS — O10919 Unspecified pre-existing hypertension complicating pregnancy, unspecified trimester: Secondary | ICD-10-CM | POA: Diagnosis present

## 2023-04-22 ENCOUNTER — Telehealth: Payer: Self-pay | Admitting: *Deleted

## 2023-04-22 NOTE — Telephone Encounter (Signed)
Patient left a voice message this afternoon that she wants to change her pharmacy from [facility].because she is having issues everytime with her prescriptions,  I called Jenella and informed her we have not seen her for a while but I can help change her pharmacy. She informed me she meant to call [facility] and that she did also call them and they took care of it. She thanked me for calling. Nancy Fetter

## 2023-04-23 ENCOUNTER — Ambulatory Visit: Payer: 59

## 2023-04-27 NOTE — Progress Notes (Signed)
Patient was seen on 04/21/2023 for Gestational Diabetes self-management class at the [facility]. The following learning objectives were met by the patient during this course:  States the definition of Gestational Diabetes States why dietary management is important in controlling blood glucose Describes the effects each nutrient has on blood glucose levels Demonstrates ability to create a balanced meal plan Demonstrates carbohydrate counting  States when to check blood glucose levels Demonstrates proper blood glucose monitoring techniques States the effect of stress and exercise on blood glucose levels States the importance of limiting caffeine and abstaining from alcohol and smoking  Blood glucose monitor given: Filbert Berthold Lot # [id number] Exp: 03/18/2024  Patient instructed to monitor glucose levels: FBS: 60 - <90 1 hour: <140 2 hour: <120  *Patient received handouts: Nutrition Diabetes and Pregnancy Carbohydrate Counting List  Patient will be seen for follow-up as needed.

## 2023-04-30 ENCOUNTER — Ambulatory Visit: Payer: Medicaid Other

## 2023-04-30 ENCOUNTER — Ambulatory Visit: Payer: Medicaid Other | Attending: Obstetrics

## 2023-05-06 ENCOUNTER — Ambulatory Visit: Payer: Medicaid Other | Attending: Obstetrics

## 2023-05-06 ENCOUNTER — Ambulatory Visit: Payer: Medicaid Other

## 2023-05-13 ENCOUNTER — Ambulatory Visit: Payer: Medicaid Other

## 2023-05-13 ENCOUNTER — Ambulatory Visit: Payer: Medicaid Other | Attending: Maternal & Fetal Medicine

## 2023-05-20 ENCOUNTER — Ambulatory Visit: Payer: Medicaid Other

## 2023-05-20 ENCOUNTER — Ambulatory Visit: Payer: Medicaid Other | Attending: Maternal & Fetal Medicine

## 2023-05-21 LAB — OB RESULTS CONSOLE GBS: GBS: NEGATIVE

## 2023-05-25 ENCOUNTER — Encounter (HOSPITAL_COMMUNITY): Payer: Self-pay

## 2023-05-25 NOTE — Patient Instructions (Signed)
Katherine Cooley  05/25/2023   Your procedure is scheduled on:  06/08/2023  Arrive at 0745 at Entrance C on [address] at [facility]  and [employer]. You are invited to use the FREE valet parking or use the Visitor's parking deck.  Pick up the phone at the desk and dial [phone].  Call this number if you have problems the morning of surgery: [phone]  Remember:   Do not eat food:(After Midnight) Desps de medianoche.  Do not drink clear liquids: (After Midnight) Desps de medianoche.  Take these medicines the morning of surgery with A SIP OF WATER:  none   Do not wear jewelry, make-up or nail polish.  Do not wear lotions, powders, or perfumes. Do not wear deodorant.  Do not shave 48 hours prior to surgery.  Do not bring valuables to the hospital.  [facility] is not   responsible for any belongings or valuables brought to the hospital.  Contacts, dentures or bridgework may not be worn into surgery.  Leave suitcase in the car. After surgery it may be brought to your room.  For patients admitted to the hospital, checkout time is 11:00 AM the day of              discharge.      Please read over the following fact sheets that you were given:     Preparing for Surgery

## 2023-06-04 ENCOUNTER — Other Ambulatory Visit: Payer: Self-pay | Admitting: Obstetrics and Gynecology

## 2023-06-07 ENCOUNTER — Encounter (HOSPITAL_COMMUNITY)
Admission: RE | Admit: 2023-06-07 | Discharge: 2023-06-07 | Disposition: A | Discharge status: Home / Self Care | Payer: Medicaid Other | Source: Ambulatory Visit | Attending: Obstetrics and Gynecology | Admitting: Obstetrics and Gynecology

## 2023-06-07 VITALS — Ht 65.0 in | Wt 180.0 lb

## 2023-06-07 DIAGNOSIS — Z98891 History of uterine scar from previous surgery: Secondary | ICD-10-CM | POA: Insufficient documentation

## 2023-06-07 DIAGNOSIS — Z01812 Encounter for preprocedural laboratory examination: Secondary | ICD-10-CM | POA: Insufficient documentation

## 2023-06-07 HISTORY — DX: Family history of other specified conditions: Z84.89

## 2023-06-07 LAB — BASIC METABOLIC PANEL
Anion gap: 9 (ref 5–15)
BUN: 10 mg/dL (ref 6–20)
CO2: 22 mmol/L (ref 22–32)
Calcium: 8.5 mg/dL — ABNORMAL LOW (ref 8.9–10.3)
Chloride: 105 mmol/L (ref 98–111)
Creatinine, Ser: 1.04 mg/dL — ABNORMAL HIGH (ref 0.44–1.00)
GFR, Estimated: >60 mL/min (ref >60)
Glucose, Bld: 81 mg/dL (ref 70–99)
Potassium: 3.9 mmol/L (ref 3.5–5.1)
Sodium: 136 mmol/L (ref 135–145)

## 2023-06-07 LAB — CBC
HCT: 30.5 % — ABNORMAL LOW (ref 36.0–46.0)
Hemoglobin: 9.8 g/dL — ABNORMAL LOW (ref 12.0–15.0)
MCH: 27.7 pg (ref 26.0–34.0)
MCHC: 32.1 g/dL (ref 30.0–36.0)
MCV: 86.2 fL (ref 80.0–100.0)
Platelets: 318 10*3/uL (ref 150–400)
RBC: 3.54 MIL/uL — ABNORMAL LOW (ref 3.87–5.11)
RDW: 14.2 % (ref 11.5–15.5)
WBC: 12.3 10*3/uL — ABNORMAL HIGH (ref 4.0–10.5)
nRBC: 0.5 % — ABNORMAL HIGH (ref 0.0–0.2)

## 2023-06-07 LAB — TYPE AND SCREEN
ABO/RH(D): A POS
Antibody Screen: NEGATIVE

## 2023-06-07 LAB — RPR: RPR Ser Ql: NONREACTIVE

## 2023-06-08 ENCOUNTER — Inpatient Hospital Stay (HOSPITAL_COMMUNITY): Payer: Medicaid Other | Admitting: Anesthesiology

## 2023-06-08 ENCOUNTER — Encounter (HOSPITAL_COMMUNITY)
Admission: RE | Disposition: A | Discharge status: Home / Self Care | Payer: Self-pay | Source: Ambulatory Visit | Attending: Obstetrics and Gynecology

## 2023-06-08 ENCOUNTER — Other Ambulatory Visit: Payer: Self-pay

## 2023-06-08 ENCOUNTER — Inpatient Hospital Stay (HOSPITAL_COMMUNITY)
Admission: RE | Admit: 2023-06-08 | Discharge: 2023-06-12 | DRG: 784 | Disposition: A | Discharge status: Home / Self Care | Payer: Medicaid Other | Source: Ambulatory Visit | Attending: Obstetrics and Gynecology | Admitting: Obstetrics and Gynecology

## 2023-06-08 ENCOUNTER — Encounter (HOSPITAL_COMMUNITY): Payer: Self-pay | Admitting: Obstetrics and Gynecology

## 2023-06-08 DIAGNOSIS — R140 Abdominal distension (gaseous): Secondary | ICD-10-CM | POA: Diagnosis not present

## 2023-06-08 DIAGNOSIS — O24420 Gestational diabetes mellitus in childbirth, diet controlled: Secondary | ICD-10-CM | POA: Diagnosis present

## 2023-06-08 DIAGNOSIS — D509 Iron deficiency anemia, unspecified: Secondary | ICD-10-CM | POA: Diagnosis present

## 2023-06-08 DIAGNOSIS — O34211 Maternal care for low transverse scar from previous cesarean delivery: Secondary | ICD-10-CM | POA: Diagnosis present

## 2023-06-08 DIAGNOSIS — Z23 Encounter for immunization: Secondary | ICD-10-CM

## 2023-06-08 DIAGNOSIS — Z302 Encounter for sterilization: Secondary | ICD-10-CM

## 2023-06-08 DIAGNOSIS — O1002 Pre-existing essential hypertension complicating childbirth: Secondary | ICD-10-CM | POA: Diagnosis not present

## 2023-06-08 DIAGNOSIS — Z3A39 39 weeks gestation of pregnancy: Secondary | ICD-10-CM

## 2023-06-08 DIAGNOSIS — O24419 Gestational diabetes mellitus in pregnancy, unspecified control: Secondary | ICD-10-CM | POA: Diagnosis present

## 2023-06-08 DIAGNOSIS — O9902 Anemia complicating childbirth: Secondary | ICD-10-CM | POA: Diagnosis present

## 2023-06-08 DIAGNOSIS — Z01812 Encounter for preprocedural laboratory examination: Secondary | ICD-10-CM | POA: Diagnosis not present

## 2023-06-08 DIAGNOSIS — O1092 Unspecified pre-existing hypertension complicating childbirth: Secondary | ICD-10-CM | POA: Diagnosis present

## 2023-06-08 DIAGNOSIS — Z98891 History of uterine scar from previous surgery: Principal | ICD-10-CM

## 2023-06-08 DIAGNOSIS — R109 Unspecified abdominal pain: Secondary | ICD-10-CM | POA: Diagnosis not present

## 2023-06-08 LAB — GLUCOSE, CAPILLARY: Glucose-Capillary: 76 mg/dL (ref 70–99)

## 2023-06-08 SURGERY — Surgical Case
Anesthesia: Spinal

## 2023-06-08 MED ORDER — NALOXONE HCL 4 MG/10ML IJ SOLN: 1.0000 ug/kg/h | INTRAVENOUS | Status: AC | PRN

## 2023-06-08 MED ORDER — KETOROLAC TROMETHAMINE 30 MG/ML IJ SOLN: 30.0000 mg | Freq: Four times a day (QID) | INTRAMUSCULAR | Status: AC | PRN

## 2023-06-08 MED ORDER — HYDROMORPHONE HCL 2 MG PO TABS
2.0000 mg | ORAL_TABLET | Freq: Four times a day (QID) | ORAL | Status: DC | PRN
  Administered 2023-06-10: 2 mg via ORAL
  Filled 2023-06-08: qty 1

## 2023-06-08 MED ORDER — SOD CITRATE-CITRIC ACID 500-334 MG/5ML PO SOLN
30.0000 mL | ORAL | Status: AC
  Administered 2023-06-08: 30 mL via ORAL

## 2023-06-08 MED ORDER — METOCLOPRAMIDE HCL 5 MG/ML IJ SOLN
INTRAMUSCULAR | Status: AC
  Filled 2023-06-08: qty 2

## 2023-06-08 MED ORDER — ONDANSETRON HCL 4 MG/2ML IJ SOLN: 4.0000 mg | Freq: Three times a day (TID) | INTRAMUSCULAR | Status: DC | PRN

## 2023-06-08 MED ORDER — DIPHENHYDRAMINE HCL 25 MG PO CAPS: 25.0000 mg | ORAL_CAPSULE | ORAL | Status: DC | PRN

## 2023-06-08 MED ORDER — OXYTOCIN-SODIUM CHLORIDE 30-0.9 UT/500ML-% IV SOLN: 2.5000 [IU]/h | INTRAVENOUS | Status: AC

## 2023-06-08 MED ORDER — LACTATED RINGERS IV SOLN: INTRAVENOUS | Status: DC

## 2023-06-08 MED ORDER — KETOROLAC TROMETHAMINE 30 MG/ML IJ SOLN
30.0000 mg | Freq: Once | INTRAMUSCULAR | Status: AC | PRN
  Administered 2023-06-08: 30 mg via INTRAVENOUS

## 2023-06-08 MED ORDER — MEPERIDINE HCL 25 MG/ML IJ SOLN: 6.2500 mg | INTRAMUSCULAR | Status: DC | PRN

## 2023-06-08 MED ORDER — SIMETHICONE 80 MG PO CHEW
80.0000 mg | CHEWABLE_TABLET | Freq: Three times a day (TID) | ORAL | Status: DC
  Administered 2023-06-08 – 2023-06-11 (×8): 80 mg via ORAL
  Filled 2023-06-08 (×8): qty 1

## 2023-06-08 MED ORDER — METOCLOPRAMIDE HCL 5 MG/ML IJ SOLN
INTRAMUSCULAR | Status: DC | PRN
Start: 2023-06-08 — End: 2023-06-08
  Administered 2023-06-08: 10 mg via INTRAVENOUS

## 2023-06-08 MED ORDER — ACETAMINOPHEN 500 MG PO TABS: 1000.0000 mg | ORAL_TABLET | Freq: Four times a day (QID) | ORAL | Status: DC

## 2023-06-08 MED ORDER — DROPERIDOL 2.5 MG/ML IJ SOLN: 0.6250 mg | Freq: Once | INTRAMUSCULAR | Status: DC | PRN

## 2023-06-08 MED ORDER — SODIUM CHLORIDE 0.9 % IV SOLN
500.0000 mg | INTRAVENOUS | Status: DC
  Filled 2023-06-08: qty 5

## 2023-06-08 MED ORDER — MENTHOL 3 MG MT LOZG: 1.0000 | LOZENGE | OROMUCOSAL | Status: DC | PRN

## 2023-06-08 MED ORDER — SODIUM CHLORIDE 0.9 % IR SOLN
Status: DC | PRN
  Administered 2023-06-08: 1

## 2023-06-08 MED ORDER — COCONUT OIL OIL
1.0000 | TOPICAL_OIL | Status: DC | PRN
  Administered 2023-06-11: 1 via TOPICAL

## 2023-06-08 MED ORDER — ACETAMINOPHEN 500 MG PO TABS
1000.0000 mg | ORAL_TABLET | Freq: Four times a day (QID) | ORAL | Status: DC
  Administered 2023-06-08 – 2023-06-12 (×13): 1000 mg via ORAL
  Filled 2023-06-08 (×16): qty 2

## 2023-06-08 MED ORDER — KETOROLAC TROMETHAMINE 30 MG/ML IJ SOLN
INTRAMUSCULAR | Status: AC
  Filled 2023-06-08: qty 1

## 2023-06-08 MED ORDER — DIPHENHYDRAMINE HCL 50 MG/ML IJ SOLN: 12.5000 mg | INTRAMUSCULAR | Status: DC | PRN

## 2023-06-08 MED ORDER — SIMETHICONE 80 MG PO CHEW
80.0000 mg | CHEWABLE_TABLET | ORAL | Status: DC | PRN
  Administered 2023-06-09: 80 mg via ORAL
  Filled 2023-06-08: qty 1

## 2023-06-08 MED ORDER — ACETAMINOPHEN 10 MG/ML IV SOLN
INTRAVENOUS | Status: AC
  Filled 2023-06-08: qty 100

## 2023-06-08 MED ORDER — FENTANYL CITRATE (PF) 100 MCG/2ML IJ SOLN
INTRAMUSCULAR | Status: AC
  Filled 2023-06-08: qty 2

## 2023-06-08 MED ORDER — ACETAMINOPHEN 10 MG/ML IV SOLN
INTRAVENOUS | Status: DC | PRN
  Administered 2023-06-08: 1000 mg via INTRAVENOUS

## 2023-06-08 MED ORDER — SODIUM CHLORIDE 0.9% FLUSH: 3.0000 mL | INTRAVENOUS | Status: DC | PRN

## 2023-06-08 MED ORDER — PHENYLEPHRINE HCL-NACL 20-0.9 MG/250ML-% IV SOLN
INTRAVENOUS | Status: AC
  Filled 2023-06-08: qty 250

## 2023-06-08 MED ORDER — SCOPOLAMINE 1 MG/3DAYS TD PT72
MEDICATED_PATCH | TRANSDERMAL | Status: DC | PRN
  Administered 2023-06-08: 1 via TRANSDERMAL

## 2023-06-08 MED ORDER — NALOXONE HCL 0.4 MG/ML IJ SOLN: 0.4000 mg | INTRAMUSCULAR | Status: DC | PRN

## 2023-06-08 MED ORDER — TRANEXAMIC ACID-NACL 1000-0.7 MG/100ML-% IV SOLN
INTRAVENOUS | Status: AC
  Filled 2023-06-08: qty 100

## 2023-06-08 MED ORDER — KETOROLAC TROMETHAMINE 30 MG/ML IJ SOLN
30.0000 mg | Freq: Four times a day (QID) | INTRAMUSCULAR | Status: AC
  Administered 2023-06-08 – 2023-06-09 (×4): 30 mg via INTRAVENOUS
  Filled 2023-06-08 (×4): qty 1

## 2023-06-08 MED ORDER — ZOLPIDEM TARTRATE 5 MG PO TABS: 5.0000 mg | ORAL_TABLET | Freq: Every evening | ORAL | Status: DC | PRN

## 2023-06-08 MED ORDER — BUPIVACAINE IN DEXTROSE 0.75-8.25 % IT SOLN
INTRATHECAL | Status: DC | PRN
  Administered 2023-06-08: 1.8 mL via INTRATHECAL

## 2023-06-08 MED ORDER — STERILE WATER FOR IRRIGATION IR SOLN
Status: DC | PRN
  Administered 2023-06-08: 1000 mL

## 2023-06-08 MED ORDER — SCOPOLAMINE 1 MG/3DAYS TD PT72: 1.0000 | MEDICATED_PATCH | Freq: Once | TRANSDERMAL | Status: DC

## 2023-06-08 MED ORDER — DIPHENHYDRAMINE HCL 25 MG PO CAPS: 25.0000 mg | ORAL_CAPSULE | Freq: Four times a day (QID) | ORAL | Status: DC | PRN

## 2023-06-08 MED ORDER — IBUPROFEN 600 MG PO TABS
600.0000 mg | ORAL_TABLET | Freq: Four times a day (QID) | ORAL | Status: DC
  Administered 2023-06-09 – 2023-06-12 (×11): 600 mg via ORAL
  Filled 2023-06-08 (×11): qty 1

## 2023-06-08 MED ORDER — CEFAZOLIN SODIUM-DEXTROSE 2-4 GM/100ML-% IV SOLN
INTRAVENOUS | Status: AC
  Filled 2023-06-08: qty 100

## 2023-06-08 MED ORDER — OXYTOCIN-SODIUM CHLORIDE 30-0.9 UT/500ML-% IV SOLN
INTRAVENOUS | Status: AC
  Filled 2023-06-08: qty 500

## 2023-06-08 MED ORDER — SENNOSIDES-DOCUSATE SODIUM 8.6-50 MG PO TABS
2.0000 | ORAL_TABLET | Freq: Every day | ORAL | Status: DC
  Administered 2023-06-09 – 2023-06-11 (×3): 2 via ORAL
  Filled 2023-06-08 (×3): qty 2

## 2023-06-08 MED ORDER — TRANEXAMIC ACID-NACL 1000-0.7 MG/100ML-% IV SOLN
INTRAVENOUS | Status: DC | PRN
  Administered 2023-06-08: 1000 mg via INTRAVENOUS

## 2023-06-08 MED ORDER — MORPHINE SULFATE (PF) 0.5 MG/ML IJ SOLN
INTRAMUSCULAR | Status: AC
  Filled 2023-06-08: qty 10

## 2023-06-08 MED ORDER — DEXMEDETOMIDINE HCL IN NACL 80 MCG/20ML IV SOLN
INTRAVENOUS | Status: DC | PRN
  Administered 2023-06-08: 8 ug via INTRAVENOUS
  Administered 2023-06-08: 4 ug via INTRAVENOUS
  Administered 2023-06-08: 8 ug via INTRAVENOUS

## 2023-06-08 MED ORDER — MORPHINE SULFATE (PF) 0.5 MG/ML IJ SOLN
INTRAMUSCULAR | Status: DC | PRN
  Administered 2023-06-08: 150 ug via INTRATHECAL

## 2023-06-08 MED ORDER — ONDANSETRON HCL 4 MG/2ML IJ SOLN
INTRAMUSCULAR | Status: DC | PRN
  Administered 2023-06-08: 4 mg via INTRAVENOUS

## 2023-06-08 MED ORDER — FENTANYL CITRATE (PF) 100 MCG/2ML IJ SOLN
25.0000 ug | INTRAMUSCULAR | Status: DC | PRN
  Administered 2023-06-08 (×2): 50 ug via INTRAVENOUS

## 2023-06-08 MED ORDER — PHENYLEPHRINE 80 MCG/ML (10ML) SYRINGE FOR IV PUSH (FOR BLOOD PRESSURE SUPPORT)
PREFILLED_SYRINGE | INTRAVENOUS | Status: AC
  Filled 2023-06-08: qty 10

## 2023-06-08 MED ORDER — OXYTOCIN-SODIUM CHLORIDE 30-0.9 UT/500ML-% IV SOLN
INTRAVENOUS | Status: DC | PRN
  Administered 2023-06-08: 300 mL via INTRAVENOUS

## 2023-06-08 MED ORDER — CEFAZOLIN SODIUM-DEXTROSE 2-4 GM/100ML-% IV SOLN
2.0000 g | INTRAVENOUS | Status: AC
  Administered 2023-06-08: 2 g via INTRAVENOUS

## 2023-06-08 MED ORDER — FENTANYL CITRATE (PF) 100 MCG/2ML IJ SOLN
INTRAMUSCULAR | Status: DC | PRN
  Administered 2023-06-08 (×2): 25 ug via INTRAVENOUS
  Administered 2023-06-08: 35 ug via INTRAVENOUS

## 2023-06-08 MED ORDER — FENTANYL CITRATE (PF) 100 MCG/2ML IJ SOLN
INTRAMUSCULAR | Status: DC | PRN
  Administered 2023-06-08: 15 ug via INTRATHECAL

## 2023-06-08 MED ORDER — PRENATAL MULTIVITAMIN CH
1.0000 | ORAL_TABLET | Freq: Every day | ORAL | Status: DC
  Administered 2023-06-09 – 2023-06-11 (×3): 1 via ORAL
  Filled 2023-06-08 (×3): qty 1

## 2023-06-08 MED ORDER — WITCH HAZEL-GLYCERIN EX PADS: 1.0000 | MEDICATED_PAD | CUTANEOUS | Status: DC | PRN

## 2023-06-08 MED ORDER — SOD CITRATE-CITRIC ACID 500-334 MG/5ML PO SOLN
ORAL | Status: AC
  Filled 2023-06-08: qty 30

## 2023-06-08 MED ORDER — DEXAMETHASONE SODIUM PHOSPHATE 10 MG/ML IJ SOLN
INTRAMUSCULAR | Status: DC | PRN
  Administered 2023-06-08: 10 mg via INTRAVENOUS

## 2023-06-08 MED ORDER — PHENYLEPHRINE HCL-NACL 20-0.9 MG/250ML-% IV SOLN
INTRAVENOUS | Status: DC | PRN
  Administered 2023-06-08: 40 ug/min via INTRAVENOUS
  Administered 2023-06-08: 60 ug/min via INTRAVENOUS

## 2023-06-08 MED ORDER — DIBUCAINE (PERIANAL) 1 % EX OINT: 1.0000 | TOPICAL_OINTMENT | CUTANEOUS | Status: DC | PRN

## 2023-06-08 MED ORDER — ONDANSETRON HCL 4 MG/2ML IJ SOLN
INTRAMUSCULAR | Status: AC
  Filled 2023-06-08: qty 2

## 2023-06-08 MED ORDER — DEXAMETHASONE SODIUM PHOSPHATE 4 MG/ML IJ SOLN
INTRAMUSCULAR | Status: AC
  Filled 2023-06-08: qty 2

## 2023-06-08 SURGICAL SUPPLY — 38 items
APL PRP STRL LF DISP 70% ISPRP (MISCELLANEOUS) ×4
APL SKNCLS STERI-STRIP NONHPOA (GAUZE/BANDAGES/DRESSINGS) ×2
BARRIER ADHS 3X4 INTERCEED (GAUZE/BANDAGES/DRESSINGS) ×1 IMPLANT
BENZOIN TINCTURE PRP APPL 2/3 (GAUZE/BANDAGES/DRESSINGS) ×3 IMPLANT
BRR ADH 4X3 ABS CNTRL BYND (GAUZE/BANDAGES/DRESSINGS) ×2
CHLORAPREP W/TINT 26 (MISCELLANEOUS) ×6 IMPLANT
CLAMP UMBILICAL CORD (MISCELLANEOUS) ×3 IMPLANT
CLOTH BEACON ORANGE TIMEOUT ST (SAFETY) ×3 IMPLANT
DRSG OPSITE POSTOP 4X10 (GAUZE/BANDAGES/DRESSINGS) ×3 IMPLANT
ELECT REM PT RETURN 9FT ADLT (ELECTROSURGICAL) ×2
ELECTRODE REM PT RTRN 9FT ADLT (ELECTROSURGICAL) ×3 IMPLANT
EXTRACTOR VACUUM M CUP 4 TUBE (SUCTIONS) IMPLANT
GLOVE BIO SURGEON STRL SZ7.5 (GLOVE) ×3 IMPLANT
GLOVE BIOGEL PI IND STRL 7.0 (GLOVE) ×3 IMPLANT
GLOVE BIOGEL PI IND STRL 7.5 (GLOVE) ×3 IMPLANT
GOWN STRL REUS W/TWL LRG LVL3 (GOWN DISPOSABLE) ×6 IMPLANT
KIT ABG SYR 3ML LUER SLIP (SYRINGE) IMPLANT
LIGASURE IMPACT 36 18CM CVD LR (INSTRUMENTS) ×1 IMPLANT
MAT PREVALON FULL STRYKER (MISCELLANEOUS) ×1 IMPLANT
NDL HYPO 25X5/8 SAFETYGLIDE (NEEDLE) IMPLANT
NEEDLE HYPO 25X5/8 SAFETYGLIDE (NEEDLE) IMPLANT
NS IRRIG 1000ML POUR BTL (IV SOLUTION) ×3 IMPLANT
PACK C SECTION WH (CUSTOM PROCEDURE TRAY) ×3 IMPLANT
PAD OB MATERNITY 4.3X12.25 (PERSONAL CARE ITEMS) ×3 IMPLANT
RTRCTR C-SECT PINK 25CM LRG (MISCELLANEOUS) ×3 IMPLANT
STRIP CLOSURE SKIN 1/2X4 (GAUZE/BANDAGES/DRESSINGS) ×3 IMPLANT
SUT CHROMIC 2 0 CT 1 (SUTURE) ×3 IMPLANT
SUT MNCRL 0 VIOLET CTX 36 (SUTURE) ×3 IMPLANT
SUT MNCRL AB 3-0 PS2 27 (SUTURE) ×3 IMPLANT
SUT PLAIN 2 0 XLH (SUTURE) ×3 IMPLANT
SUT VIC AB 0 CT1 36 (SUTURE) ×5 IMPLANT
SUT VIC AB 0 CTX 36 (SUTURE) ×6
SUT VIC AB 0 CTX36XBRD ANBCTRL (SUTURE) ×9 IMPLANT
SUT VIC AB 2-0 SH 27 (SUTURE)
SUT VIC AB 2-0 SH 27XBRD (SUTURE) IMPLANT
TOWEL OR 17X24 6PK STRL BLUE (TOWEL DISPOSABLE) ×3 IMPLANT
TRAY FOLEY W/BAG SLVR 14FR LF (SET/KITS/TRAYS/PACK) ×3 IMPLANT
WATER STERILE IRR 1000ML POUR (IV SOLUTION) ×3 IMPLANT
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
## 2023-06-08 NOTE — Anesthesia Preprocedure Evaluation (Signed)
Anesthesia Evaluation  Patient identified by MRN, date of birth, ID band Patient awake    Reviewed: Allergy & Precautions, H&P , NPO status , Patient's Chart, lab work & pertinent test results  Airway Mallampati: II  TM Distance: >3 FB Neck ROM: Full    Dental no notable dental hx.    Pulmonary neg pulmonary ROS   Pulmonary exam normal breath sounds clear to auscultation       Cardiovascular hypertension, Normal cardiovascular exam Rhythm:Regular Rate:Normal  chtn   Neuro/Psych negative neurological ROS  negative psych ROS   GI/Hepatic negative GI ROS, Neg liver ROS,,,  Endo/Other  diabetes, Gestational    Renal/GU negative Renal ROS  negative genitourinary   Musculoskeletal negative musculoskeletal ROS (+)    Abdominal   Peds negative pediatric ROS (+)  Hematology  (+) Blood dyscrasia, anemia Plt 318   Anesthesia Other Findings gDM  Reproductive/Obstetrics (+) Pregnancy                              Anesthesia Physical Anesthesia Plan  ASA: 2  Anesthesia Plan: Spinal   Post-op Pain Management:    Induction:   PONV Risk Score and Plan: Treatment may vary due to age or medical condition  Airway Management Planned: Natural Airway  Additional Equipment:   Intra-op Plan:   Post-operative Plan:   Informed Consent: I have reviewed the patients History and Physical, chart, labs and discussed the procedure including the risks, benefits and alternatives for the proposed anesthesia with the patient or authorized representative who has indicated his/her understanding and acceptance.     Dental advisory given  Plan Discussed with: CRNA  Anesthesia Plan Comments:          Anesthesia Quick Evaluation

## 2023-06-08 NOTE — Lactation Note (Signed)
This note was copied from a baby's chart. Lactation Consultation Note  Patient Name: Katherine Cooley Date: 06/08/2023 Age:41 hours Reason for consult: Initial assessment;Term;Maternal endocrine disorder (See MOB -MR, 3rd C/S delivery.)  P3, term female infant was cuing to BF, MOB latched infant on her right breast using the football hold position with pillow support, infant latched with depth, swallows heard, infant was still BF after 11 minutes when LC left the room. MOB will continue to BF infant by cues, every 2-3 hours, skin to skin. LC discussed importance of maternal rest, diet and hydration. MOB knows to call if further latch assistance is needed. LC discussed infant I&O, infant had one void and one stool since birth. LC discussed hand expression using breast model and MOB taught back. MOB made aware of O/P services, breastfeeding support groups, community resources, and our phone # for post-discharge questions.    Maternal Data Has patient been taught Hand Expression?: Yes Does the patient have breastfeeding experience prior to this delivery?: Yes How long did the patient breastfeed?: MOB, hx low milk supply BF previous two children for 4 months each.  Feeding Mother's Current Feeding Choice: Breast Milk  LATCH Score Latch: Grasps breast easily, tongue down, lips flanged, rhythmical sucking.  Audible Swallowing: Spontaneous and intermittent (swallows heard while infant was latched.)  Type of Nipple: Everted at rest and after stimulation  Comfort (Breast/Nipple): Soft / non-tender  Hold (Positioning): Assistance needed to correctly position infant at breast and maintain latch.  LATCH Score: 9   Lactation Tools Discussed/Used    Interventions Interventions: Breast feeding basics reviewed;Assisted with latch;Skin to skin;Breast massage;Adjust position;Breast compression;Support pillows;Position options;Education;LC Services brochure  Discharge Pump:  DEBP;Personal  Consult Status Consult Status: Follow-up Date: 06/09/23 Follow-up type: In-patient    Frederico Hamman 06/08/2023, 9:54 PM

## 2023-06-08 NOTE — Transfer of Care (Signed)
Immediate Anesthesia Transfer of Care Note  Patient: Katherine Cooley  Procedure(s) Performed: CESAREAN SECTION WITH BILATERAL TUBAL LIGATION  Patient Location: PACU  Anesthesia Type:Spinal  Level of Consciousness: awake, alert , and oriented  Airway & Oxygen Therapy: Patient Spontanous Breathing  Post-op Assessment: Report given to RN and Post -op Vital signs reviewed and stable  Post vital signs: Reviewed and stable  Last Vitals:  Vitals Value Taken Time  BP 131/77 06/08/23 1202  Temp    Pulse 92 06/08/23 1204  Resp 19 06/08/23 1204  SpO2 97 % 06/08/23 1204  Vitals shown include unfiled device data.  Last Pain:  Vitals:   06/08/23 0819  TempSrc: Oral      Patients Stated Pain Goal: 5 (06/08/23 0819)  Complications: No notable events documented.

## 2023-06-08 NOTE — Anesthesia Postprocedure Evaluation (Signed)
Anesthesia Post Note  Patient: Katherine Cooley  Procedure(s) Performed: CESAREAN SECTION WITH BILATERAL TUBAL LIGATION     Patient location during evaluation: PACU Anesthesia Type: Spinal Level of consciousness: oriented and awake and alert Pain management: pain level controlled Vital Signs Assessment: post-procedure vital signs reviewed and stable Respiratory status: spontaneous breathing, respiratory function stable and patient connected to nasal cannula oxygen Cardiovascular status: blood pressure returned to baseline and stable Postop Assessment: no headache, no backache and no apparent nausea or vomiting Anesthetic complications: no   No notable events documented.  Last Vitals:  Vitals:   06/08/23 1300 06/08/23 1308  BP: 113/79 113/79  Pulse: 71   Resp: 15 15  Temp:  (!) 36.4 C  SpO2: 98%     Last Pain:  Vitals:   06/08/23 1308  TempSrc: Axillary  PainSc: 2                  Sparkill Nation

## 2023-06-08 NOTE — Op Note (Signed)
Cesarean Section Procedure Note  Indications: P2 at 47 1/7wks admitted for repeat c-section and desires permanent sterilization via bilateral salpingectomy after rba discussed.  Pre-operative Diagnosis: 1.39 1/7wks 2.h/o C-section 3.Desires sterilization   Post-operative Diagnosis: 1.39 1/7wks 2.h/o C-section 3.Desires sterilization 4.LOA  Procedure: CESAREAN SECTION  Surgeon: Osborn Coho, MD    Assistants: Wyn Forster, MD  Anesthesia: Regional  Anesthesiologist: [place] Nation, MD   Procedure Details  The patient was taken to the operating room after the risks, benefits, complications, treatment options, and expected outcomes were discussed with the patient.  The patient concurred with the proposed plan, giving informed consent which was signed and witnessed. The patient was taken to Operating Room B, identified as Katherine Cooley and the procedure verified as C-Section Delivery. A Time Out was held and the above information confirmed.  After induction of anesthesia by obtaining a surgical level via the spinal, the patient was prepped and draped in the usual sterile manner. A Pfannenstiel skin incision was made and carried down through the subcutaneous tissue to the underlying layer of fascia.  The fascia was incised bilaterally and extended transversely bilaterally with the Mayo scissors. Kocher clamps were placed on the inferior aspect of the fascial incision and the underlying rectus muscle was separated from the fascia. The same was done on the superior aspect of the fascial incision.  The peritoneum was identified, entered bluntly and extended manually. There was a thick adhesion from anterior wall of the abdomen to the anterior wall of the uterus which was excised with the bovie.  An omental adhesion was removed as well by clamping with two kelly clamps, cutting and ligating with 0 vicryl.  An Alexis self-retaining retractor was placed.  The utero-vesical peritoneal reflection was  incised transversely and the bladder flap was bluntly freed from the lower uterine segment. A low transverse uterine incision was made with the scalpel and extended bilaterally with the bandage scissors.  The infant was delivered in vertex position without difficulty. After the umbilical cord was clamped and cut, the infant was handed to the awaiting pediatricians.  Cord blood was obtained for evaluation.  The placenta was removed intact and appeared to be within normal limits. The uterus was cleared of all clots and debris. The uterine incision was closed with running interlocking sutures of 0 Vicryl and a second imbricating layer was performed as well with 2-0 monocryl.   Bilateral tubes and ovaries appeared to be within normal limits.  Good hemostasis was noted.  Copious irrigation was performed until clear.  The uterus was tilted to the right, grasped in the midportion with a babcock after carrying it out to its fimbriated end and excised with the ligasure.  The same was done on the contralateral side.  The peritoneum was repaired with 2-0 chromic via a running suture.  The fascia was reapproximated with a running suture of 0 Vicryl.  The skin was reapproximated with a subcuticular suture of 3-0 monocryl.  Steristrips were applied with benzoin.  Instrument, sponge, and needle counts were correct prior to abdominal closure and at the conclusion of the case.  The patient was awaiting transfer to the recovery room in good condition.  Findings: Live female infant with Apgars 8 at one minute and 9 at five minutes.  Normal appearing bilateral ovaries and fallopian tubes were noted.  Estimated Blood Loss:  400 ml         Drains: foley to gravity 125 cc  Total IV Fluids: 1000 ml         Specimens to Pathology: Bilateral Fallopian Tubes         Complications:  None; patient tolerated the procedure well.         Disposition: PACU - hemodynamically stable.         Condition: stable  Attending  Attestation: I performed the procedure.  I was present and scrubbed and the assistant was required due to complexity of anatomy.

## 2023-06-08 NOTE — Anesthesia Procedure Notes (Signed)
Spinal  Patient location during procedure: OR Start time: 06/08/2023 10:38 AM End time: 06/08/2023 10:38 AM Reason for block: surgical anesthesia Staffing Performed: anesthesiologist  Anesthesiologist: [place] Nation, MD Performed by: [place] Nation, MD Authorized by: Foss Nation, MD   Preanesthetic Checklist Completed: patient identified, IV checked, site marked, risks and benefits discussed, surgical consent, monitors and equipment checked, pre-op evaluation and timeout performed Spinal Block Patient position: sitting Prep: DuraPrep Patient monitoring: heart rate, cardiac monitor, continuous pulse ox and blood pressure Approach: midline Location: L4-5 Needle Needle type: Pencan  Needle gauge: 24 G Assessment Sensory level: T6 Additional Notes Functioning IV was confirmed and monitors were applied. Sterile prep and drape, including hand hygiene and sterile gloves were used. The patient was positioned and the spine was prepped. The skin was anesthetized with lidocaine.  Free flow of clear CSF was obtained prior to injecting local anesthetic into the CSF.  The spinal needle aspirated freely following injection.  The needle was carefully withdrawn.  The patient tolerated the procedure well.

## 2023-06-09 LAB — BASIC METABOLIC PANEL
Anion gap: 9 (ref 5–15)
BUN: 11 mg/dL (ref 6–20)
CO2: 22 mmol/L (ref 22–32)
Calcium: 8.3 mg/dL — ABNORMAL LOW (ref 8.9–10.3)
Chloride: 104 mmol/L (ref 98–111)
Creatinine, Ser: 0.91 mg/dL (ref 0.44–1.00)
GFR, Estimated: >60 mL/min (ref >60)
Glucose, Bld: 113 mg/dL — ABNORMAL HIGH (ref 70–99)
Potassium: 4.2 mmol/L (ref 3.5–5.1)
Sodium: 135 mmol/L (ref 135–145)

## 2023-06-09 LAB — CBC
HCT: 17.8 % — ABNORMAL LOW (ref 36.0–46.0)
Hemoglobin: 5.7 g/dL — CL (ref 12.0–15.0)
MCH: 26.9 pg (ref 26.0–34.0)
MCHC: 32 g/dL (ref 30.0–36.0)
MCV: 84 fL (ref 80.0–100.0)
Platelets: 251 10*3/uL (ref 150–400)
RBC: 2.12 MIL/uL — ABNORMAL LOW (ref 3.87–5.11)
RDW: 14.3 % (ref 11.5–15.5)
WBC: 25.6 10*3/uL — ABNORMAL HIGH (ref 4.0–10.5)
nRBC: 0.1 % (ref 0.0–0.2)

## 2023-06-09 LAB — BIRTH TISSUE RECOVERY COLLECTION (PLACENTA DONATION)

## 2023-06-09 MED ORDER — IRON SUCROSE 500 MG IVPB - SIMPLE MED
500.0000 mg | Freq: Once | INTRAVENOUS | Status: DC
  Filled 2023-06-09: qty 275

## 2023-06-09 MED ORDER — SODIUM CHLORIDE 0.9 % IV SOLN
500.0000 mg | Freq: Once | INTRAVENOUS | Status: AC
  Administered 2023-06-09: 500 mg via INTRAVENOUS
  Filled 2023-06-09: qty 25

## 2023-06-09 MED ORDER — PANTOPRAZOLE SODIUM 40 MG PO TBEC
40.0000 mg | DELAYED_RELEASE_TABLET | Freq: Every day | ORAL | Status: DC
  Administered 2023-06-09 – 2023-06-11 (×3): 40 mg via ORAL
  Filled 2023-06-09 (×3): qty 1

## 2023-06-09 MED ORDER — INFLUENZA VIRUS VACC SPLIT PF (FLUZONE) 0.5 ML IM SUSY
0.5000 mL | PREFILLED_SYRINGE | INTRAMUSCULAR | Status: AC
  Administered 2023-06-12: 0.5 mL via INTRAMUSCULAR
  Filled 2023-06-09: qty 0.5

## 2023-06-09 NOTE — Progress Notes (Addendum)
Subjective: Postpartum Day 1: Cesarean Delivery Patient reports tolerating PO and no problems voiding.  Patient has ambulated and without light headedness or dizziness.  She has had small vaginal bleeding.   Objective: Vital signs in last 24 hours: Temp:  [97.5 F (36.4 C)-98.4 F (36.9 C)] 98.4 F (36.9 C) (10/09 0200) Pulse Rate:  [68-96] 75 (10/09 0200) Resp:  [14-19] 18 (10/09 0200) BP: (113-136)/(68-90) 121/68 (10/09 0200) SpO2:  [96 %-99 %] 99 % (10/09 0200)  Physical Exam:  General: alert, cooperative, and no distress Lochia: appropriate Uterine Fundus: firm Incision: with dressing, clean, dry and intact.  DVT Evaluation: No evidence of DVT seen on physical exam. Calf/Ankle edema is present.  Recent Labs    06/07/23 0959 06/09/23 0557  HGB 9.8* 5.7*  HCT 30.5* 17.8*    Assessment/Plan: 42 y/o [id number] POD # 1 Status post Repeat Cesarean section and tubal ligation Postoperative course complicated by severe anemia, asymptomatic anemia,   - Management option of anemia discussed, blood transfusion recommended.  Patient declines blood transfusion but will accept an IV iron infusion. Discussed risks, benefits and alternatives of IV iron infusion. - Continue with routine post-op care.  - Desires circumcision of her baby boy.  Prescilla Sours, MD 06/09/2023, 11:59 AM

## 2023-06-10 ENCOUNTER — Encounter (HOSPITAL_COMMUNITY): Payer: Self-pay | Admitting: Obstetrics and Gynecology

## 2023-06-10 ENCOUNTER — Inpatient Hospital Stay (HOSPITAL_COMMUNITY): Payer: Medicaid Other

## 2023-06-10 LAB — COMPREHENSIVE METABOLIC PANEL
ALT: 17 U/L (ref 0–44)
AST: 28 U/L (ref 15–41)
Albumin: 2.3 g/dL — ABNORMAL LOW (ref 3.5–5.0)
Alkaline Phosphatase: 60 U/L (ref 38–126)
Anion gap: 9 (ref 5–15)
BUN: 6 mg/dL (ref 6–20)
CO2: 24 mmol/L (ref 22–32)
Calcium: 9 mg/dL (ref 8.9–10.3)
Chloride: 104 mmol/L (ref 98–111)
Creatinine, Ser: 0.9 mg/dL (ref 0.44–1.00)
GFR, Estimated: >60 mL/min (ref >60)
Glucose, Bld: 84 mg/dL (ref 70–99)
Potassium: 4.4 mmol/L (ref 3.5–5.1)
Sodium: 137 mmol/L (ref 135–145)
Total Bilirubin: 0.4 mg/dL (ref 0.3–1.2)
Total Protein: 5.3 g/dL — ABNORMAL LOW (ref 6.5–8.1)

## 2023-06-10 LAB — CBC WITH DIFFERENTIAL/PLATELET
Abs Immature Granulocytes: 0.8 10*3/uL — ABNORMAL HIGH (ref 0.00–0.07)
Basophils Absolute: 0.1 10*3/uL (ref 0.0–0.1)
Basophils Relative: 0 %
Eosinophils Absolute: 0 10*3/uL (ref 0.0–0.5)
Eosinophils Relative: 0 %
HCT: 18.6 % — ABNORMAL LOW (ref 36.0–46.0)
Hemoglobin: 6.1 g/dL — CL (ref 12.0–15.0)
Immature Granulocytes: 4 %
Lymphocytes Relative: 12 %
Lymphs Abs: 2.8 10*3/uL (ref 0.7–4.0)
MCH: 27.7 pg (ref 26.0–34.0)
MCHC: 32.8 g/dL (ref 30.0–36.0)
MCV: 84.5 fL (ref 80.0–100.0)
Monocytes Absolute: 1 10*3/uL (ref 0.1–1.0)
Monocytes Relative: 5 %
Neutro Abs: 18.3 10*3/uL — ABNORMAL HIGH (ref 1.7–7.7)
Neutrophils Relative %: 79 %
Platelets: 307 10*3/uL (ref 150–400)
RBC: 2.2 MIL/uL — ABNORMAL LOW (ref 3.87–5.11)
RDW: 15.1 % (ref 11.5–15.5)
WBC: 23 10*3/uL — ABNORMAL HIGH (ref 4.0–10.5)
nRBC: 0.4 % — ABNORMAL HIGH (ref 0.0–0.2)

## 2023-06-10 LAB — SURGICAL PATHOLOGY

## 2023-06-10 MED ORDER — IOHEXOL 350 MG/ML SOLN
75.0000 mL | Freq: Once | INTRAVENOUS | Status: AC | PRN
  Administered 2023-06-10: 75 mL via INTRAVENOUS

## 2023-06-10 MED ORDER — LACTATED RINGERS IV SOLN: INTRAVENOUS | Status: AC

## 2023-06-10 MED ORDER — BISACODYL 10 MG RE SUPP: 10.0000 mg | Freq: Once | RECTAL | Status: DC

## 2023-06-10 MED ORDER — IOHEXOL 9 MG/ML PO SOLN
500.0000 mL | ORAL | Status: AC
  Administered 2023-06-10: 500 mL via ORAL

## 2023-06-10 MED ORDER — METOCLOPRAMIDE HCL 5 MG/ML IJ SOLN
10.0000 mg | Freq: Four times a day (QID) | INTRAMUSCULAR | Status: DC
  Administered 2023-06-10 – 2023-06-11 (×3): 10 mg via INTRAVENOUS
  Filled 2023-06-10 (×4): qty 2

## 2023-06-10 NOTE — Lactation Note (Signed)
This note was copied from a baby's chart. Lactation Consultation Note  Patient Name: Katherine Cooley DGLOV'F Date: 06/10/2023 Age:41 hours  Attempted to see mom but she was sleeping.   Maternal Data    Feeding    LATCH Score                    Lactation Tools Discussed/Used    Interventions    Discharge    Consult Status      Charyl Dancer 06/10/2023, 6:42 AM

## 2023-06-10 NOTE — Progress Notes (Signed)
Subjective: Postpartum Day 2: Cesarean Delivery Patient reports abdominal pain and very uncomfortable because it feels like she needs to have a BM.  She reports + flatus and no problems voiding.    Objective: Vital signs in last 24 hours: Temp:  [98.3 F (36.8 C)-98.4 F (36.9 C)] 98.3 F (36.8 C) (10/10 0520) Pulse Rate:  [82-94] 94 (10/10 0520) Resp:  [17-18] 18 (10/10 0520) BP: (126-128)/(70-73) 128/73 (10/10 0520) SpO2:  [100 %] 100 % (10/09 1744)  Physical Exam:  General: alert and mild distress Lochia: appropriate Uterine Fundus: firm Incision: healing well, dressing c/d/i DVT Evaluation: no calf tenderness, SCDs on Abdomen:soft, +guarding, no rebound, +BS (soft), moderately distended, tense but not tympanic  Recent Labs    06/09/23 0557 06/10/23 1445  HGB 5.7* 6.1*  HCT 17.8* 18.6*    Assessment/Plan: Status post Cesarean section. Postoperative course complicated by abdominal pain and distention.   Ileus vs SBO vs Pelvic hematoma with hgb 6.1 up slightly from 5.7 and wbc 23 down slightly from 25.  Ct scan of abdomen and pelvis ordered with IV and po contrast. Regaln ordered d/t propulsive actions Trial of dulcolax supp  Purcell Nails, MD 06/10/2023, 3:52 PM

## 2023-06-10 NOTE — Progress Notes (Signed)
MOB was referred for history of anxiety.  * Referral screened out by Clinical Social Worker because none of the following criteria appear to apply:  ~ History of anxiety during this pregnancy, or of post-partum depression following prior delivery.  ~ Diagnosis of anxiety within last 3 years Per OB notes, MOB did not indicate any signs/symptoms during her pregnancy.  OR  * MOB's symptoms currently being treated with medication and/or therapy.  Please contact the Clinical Social Worker if needs arise, by MOB request, or if MOB scores greater than 9/yes to question 10 on Edinburgh Postpartum Depression Screen.  Doral Digangi, LCSWA Clinical Social Worker [phone]  

## 2023-06-11 LAB — CBC
HCT: 18.7 % — ABNORMAL LOW (ref 36.0–46.0)
Hemoglobin: 6.1 g/dL — CL (ref 12.0–15.0)
MCH: 27.7 pg (ref 26.0–34.0)
MCHC: 32.6 g/dL (ref 30.0–36.0)
MCV: 85 fL (ref 80.0–100.0)
Platelets: 327 10*3/uL (ref 150–400)
RBC: 2.2 MIL/uL — ABNORMAL LOW (ref 3.87–5.11)
RDW: 14.7 % (ref 11.5–15.5)
WBC: 23 10*3/uL — ABNORMAL HIGH (ref 4.0–10.5)
nRBC: 0.7 % — ABNORMAL HIGH (ref 0.0–0.2)

## 2023-06-11 NOTE — Progress Notes (Signed)
Subjective: POD# 2 Information for the patient's newborn:  Laryn, Venning [[id number]]  female   Reports feeling "better, but somewhat tired", abd pain has improved Feeding: breast Reports tolerating PO and denies N/V, foley removed, ambulating and urinating w/o difficulty  Pain controlled with acetaminophen and ibuprofen (OTC) Denies HA/SOB/dizziness  Flatus not passing, but has had a bowel movement Vaginal bleeding is normal, no clots     Objective:  VS:  Vitals:   06/10/23 1651 06/10/23 2056 06/11/23 0510 06/11/23 1410  BP: 132/76 139/83 124/68 130/80  Pulse: 72 71 70 73  Resp: 17 17 16 16   Temp: 98.4 F (36.9 C) 98.4 F (36.9 C) 98.3 F (36.8 C) 98.6 F (37 C)  TempSrc: Oral Oral Oral Oral  SpO2: 100%  100% 100%  Weight:      Height:       No intake or output data in the 24 hours ending 06/11/23 1739   Recent Labs    06/10/23 1445 06/11/23 0517  WBC 23.0* 23.0*  HGB 6.1* 6.1*  HCT 18.6* 18.7*  PLT 307 327    Blood type: --/--/A POS (10/07 1004) Rubella: Immune (05/01 0000)    Physical Exam:  General: alert, cooperative, and no distress CV: Regular rate and rhythm or without murmur or extra heart sounds Resp: clear Abdomen: soft, tympany in all quads, normal bowel sounds in upper quads, hypoactive in lower quads Incision: honeycomb dressing lifting on corner, orders to replace dressing Uterine Fundus: firm, below umbilicus, nontender Lochia: minimal and no clots Ext: no edema, negative for tenderness, pain, and cords   Assessment/Plan: 42 y.o.   POD# 2. [id number]                  Principal Problem:   S/P cesarean section Active Problems:   GDM (gestational diabetes mellitus)  Ileus vs SBO ruled out after CT scan Routine post-op PP care          Advance diet from clear to regular Advised warm fluids and ambulation to improve GI motility Lactation support PRN Anticipate D/C POD #3  Roma Schanz, DNP, CNM 06/11/2023, 5:39 PM

## 2023-06-11 NOTE — Progress Notes (Signed)
This RN spoke to [facility], about my Pt's POC. Update then given to my Pt. Pt to remain on clear liq until Provider to see Pt at bedside

## 2023-06-11 NOTE — Lactation Note (Signed)
This note was copied from a baby's chart. Lactation Consultation Note  Patient Name: Boy Titilayo Hagans [id number] Date: 06/11/2023 Age:41 hours Reason for consult: Follow-up assessment;Term  Visited P3 term infant for follow up and discharge consult. Parent reports she wants to go home today but might not be going home til tomorrow d/t her own health. Parent reports breastfeeding is going well, but requested coconut oil for lubrication in between feeds, since cluster feeding has caused dryness and soreness. LC reviewed breastfeeding basics, cluster feeding, milk transitioning in, and discharge education in case family goes home today.  Feeding Mother's Current Feeding Choice: Breast Milk  Lactation Tools Discussed/Used Tools: Coconut oil  Interventions Interventions: Breast feeding basics reviewed;Coconut oil;Education  Discharge Discharge Education: Engorgement and breast care;Warning signs for feeding baby;Outpatient recommendation  Consult Status Consult Status: Complete Date: 06/11/23    Marcellene Shivley 06/11/2023, 11:20 AM

## 2023-06-12 DIAGNOSIS — D509 Iron deficiency anemia, unspecified: Secondary | ICD-10-CM | POA: Diagnosis present

## 2023-06-12 MED ORDER — HYDROMORPHONE HCL 2 MG PO TABS: 2.0000 mg | ORAL_TABLET | Freq: Four times a day (QID) | ORAL | 0 refills | Status: AC | PRN

## 2023-06-12 MED ORDER — IBUPROFEN 600 MG PO TABS: 600.0000 mg | ORAL_TABLET | Freq: Four times a day (QID) | ORAL | 0 refills | Status: AC

## 2023-06-12 MED ORDER — ACETAMINOPHEN 500 MG PO TABS: 1000.0000 mg | ORAL_TABLET | Freq: Four times a day (QID) | ORAL | Status: AC

## 2023-06-12 NOTE — Discharge Summary (Signed)
Postpartum Discharge Summary  Date of Service updated 06/12/23    Patient Name: Katherine Cooley DOB: 14-Jan-1981 MRN: [id number]  Date of admission: 06/08/2023 Delivery date:06/08/2023 Delivering provider: Osborn Coho Date of discharge: 06/12/2023  Admitting diagnosis: S/P cesarean section [Z98.891] Intrauterine pregnancy: [redacted]w[redacted]d     Secondary diagnosis:  Principal Problem:   S/P cesarean section Active Problems:   GDM (gestational diabetes mellitus)   Chronic iron deficiency anemia  Additional problems: none    Discharge diagnosis: Term Pregnancy Delivered and CHTN                                              Post partum procedures: iron infusion and  a CT of the abdomen and pelvis for complaints of abdominal pain and distension Augmentation: N/A Complications: None  Hospital course: Scheduled C/S   42 y.o. yo [id number] at [redacted]w[redacted]d was admitted to the hospital 06/08/2023 for scheduled cesarean section with the following indication:Elective Repeat.Delivery details are as follows:  Membrane Rupture Time/Date:  ,   Delivery Method:C-Section, Low Transverse Operative Delivery:N/A Details of operation can be found in separate operative note.  Patient had a postpartum course complicated byabdominal pain and distension and asymptomatic anemia.  Ileus and small bowel obstruction were ruled out on CT. Pt had a bowel movement. Diet was advanced successfully and she is ambulating, tolerating a regular diet, passing flatus, and urinating well. Patient is discharged home in stable condition on  06/12/23        Newborn Data: Birth date:06/08/2023 Birth time:10:42 AM Gender:Female Living status:Living Apgars:8 ,9  Weight:4190 g    Magnesium Sulfate received: No BMZ received: No Rhophylac:N/A MMR:N/A Transfusion:No, counseled on blood transfusion for her hemoglobin of 5.7 and pt declined. Iron infusion received on post-op day 1 Immunizations administered: Immunization History   Administered Date(s) Administered   Influenza,inj,Quad PF,6+ Mos 07/02/2015   Tdap 12/19/2012, 07/02/2015    Physical exam  Vitals:   06/11/23 0510 06/11/23 1410 06/11/23 2203 06/12/23 0458  BP: 124/68 130/80 131/82 120/70  Pulse: 70 73 80 93  Resp: 16 16 16 17   Temp: 98.3 F (36.8 C) 98.6 F (37 C) 98.9 F (37.2 C)   TempSrc: Oral Oral Oral   SpO2: 100% 100% 100% 100%  Weight:      Height:       General: alert, cooperative, and no distress Lochia: appropriate Uterine Fundus: firm Incision: Healing well with no significant drainage, Dressing is clean, dry, and intact DVT Evaluation: No evidence of DVT seen on physical exam. No cords or calf tenderness. No significant calf/ankle edema. Labs: Lab Results  Component Value Date   WBC 23.0 (H) 06/11/2023   HGB 6.1 (LL) 06/11/2023   HCT 18.7 (L) 06/11/2023   MCV 85.0 06/11/2023   PLT 327 06/11/2023      Latest Ref Rng & Units 06/10/2023    2:45 PM  CMP  Glucose 70 - 99 mg/dL 84   BUN 6 - 20 mg/dL 6   Creatinine 0.86 - 5.78 mg/dL 4.69   Sodium 629 - 528 mmol/L 137   Potassium 3.5 - 5.1 mmol/L 4.4   Chloride 98 - 111 mmol/L 104   CO2 22 - 32 mmol/L 24   Calcium 8.9 - 10.3 mg/dL 9.0   Total Protein 6.5 - 8.1 g/dL 5.3   Total Bilirubin 0.3 - 1.2 mg/dL  0.4   Alkaline Phos 38 - 126 U/L 60   AST 15 - 41 U/L 28   ALT 0 - 44 U/L 17    Edinburgh Score:    06/09/2023    9:38 AM  Edinburgh Postnatal Depression Scale Screening Tool  I have been able to laugh and see the funny side of things. 0  I have looked forward with enjoyment to things. 0  I have blamed myself unnecessarily when things went wrong. 1  I have been anxious or worried for no good reason. 2  I have felt scared or panicky for no good reason. 0  Things have been getting on top of me. 1  I have been so unhappy that I have had difficulty sleeping. 1  I have felt sad or miserable. 0  I have been so unhappy that I have been crying. 0  The thought of  harming myself has occurred to me. 0  Edinburgh Postnatal Depression Scale Total 5      After visit meds:  Allergies as of 06/12/2023       Reactions   Vicodin [hydrocodone-acetaminophen] Other (See Comments)   Racing pulse, reacts with heart murmur   Latex Itching   Percocet [oxycodone-acetaminophen] Other (See Comments)   Racing pulse, reacts with heart murmer        Medication List     STOP taking these medications    aspirin EC 81 MG tablet   metFORMIN 500 MG tablet Commonly known as: GLUCOPHAGE       TAKE these medications    acetaminophen 500 MG tablet Commonly known as: TYLENOL Take 2 tablets (1,000 mg total) by mouth every 6 (six) hours.   cyclobenzaprine 5 MG tablet Commonly known as: FLEXERIL Take 5 mg by mouth 3 (three) times daily.   HYDROmorphone 2 MG tablet Commonly known as: DILAUDID Take 1 tablet (2 mg total) by mouth every 6 (six) hours as needed for up to 3 days for severe pain ((when tolerating fluids)).   ibuprofen 600 MG tablet Commonly known as: ADVIL Take 1 tablet (600 mg total) by mouth every 6 (six) hours.   pantoprazole 40 MG tablet Commonly known as: PROTONIX Take 40 mg by mouth daily.   PRENATAL GUMMIES PO Take 1 capsule by mouth daily.         Discharge home in stable condition Infant Feeding: Breast Infant Disposition:home with mother Discharge instruction: per After Visit Summary and Postpartum booklet. Activity: Advance as tolerated. Pelvic rest for 6 weeks.  Diet: routine diet and iron rich diet Anticipated Birth Control: BTL done PP Postpartum Appointment:6 weeks Additional Postpartum F/U: BP check 1 week Future Appointments:No future appointments. Follow up Visit:  Follow-up Information     Osborn Coho, MD. Go in 6 week(s).   Specialty: Obstetrics and Gynecology Contact information: [address] [place] [address] [phone]                     06/12/2023 Roma Schanz, CNM

## 2023-06-15 ENCOUNTER — Telehealth (HOSPITAL_COMMUNITY): Payer: Self-pay | Admitting: Lactation Services

## 2023-06-15 NOTE — Telephone Encounter (Signed)
Katherine Cooley did not get the manual pump at discharge that she requested. Her breast are full and she needs the pump for milk removal. Will leave the manual pump at the front desk of [facility] for Katherine Cooley to pick up.

## 2023-07-03 ENCOUNTER — Telehealth (HOSPITAL_COMMUNITY): Payer: Self-pay

## 2023-07-03 NOTE — Telephone Encounter (Signed)
07/03/2023 1240  Name: Katherine Cooley MRN: [id number] DOB: 06-04-1981  Reason for Call:  Transition of Care Hospital Discharge Call  Contact Status: Patient Contact Status: Message  Language assistant needed: Interpreter Mode: Interpreter Not Needed        Follow-Up Questions:    Inocente Salles Postnatal Depression Scale:  In the Past 7 Days:    PHQ2-9 Depression Scale:     Discharge Follow-up:    Post-discharge interventions: NA  Signature  Signe Colt

## 2024-03-17 ENCOUNTER — Encounter: Payer: Self-pay | Admitting: Advanced Practice Midwife
